# Patient Record
Sex: Male | Born: 1945
Health system: Southern US, Community
[De-identification: ages and names within clinical notes are randomized; demographics above are authoritative.]

## PROBLEM LIST (undated history)

## (undated) ENCOUNTER — Emergency Department (HOSPITAL_COMMUNITY): Discharge status: Absent without leave | Payer: PRIVATE HEALTH INSURANCE

## (undated) DIAGNOSIS — E785 Hyperlipidemia, unspecified: Secondary | ICD-10-CM

## (undated) DIAGNOSIS — I219 Acute myocardial infarction, unspecified: Secondary | ICD-10-CM

## (undated) DIAGNOSIS — I10 Essential (primary) hypertension: Secondary | ICD-10-CM

## (undated) DIAGNOSIS — Z8669 Personal history of other diseases of the nervous system and sense organs: Secondary | ICD-10-CM

## (undated) DIAGNOSIS — N200 Calculus of kidney: Secondary | ICD-10-CM

## (undated) DIAGNOSIS — C4490 Unspecified malignant neoplasm of skin, unspecified: Secondary | ICD-10-CM

## (undated) DIAGNOSIS — C801 Malignant (primary) neoplasm, unspecified: Secondary | ICD-10-CM

## (undated) DIAGNOSIS — K649 Unspecified hemorrhoids: Secondary | ICD-10-CM

## (undated) DIAGNOSIS — I2510 Atherosclerotic heart disease of native coronary artery without angina pectoris: Secondary | ICD-10-CM

## (undated) DIAGNOSIS — R0602 Shortness of breath: Secondary | ICD-10-CM

## (undated) DIAGNOSIS — Z87442 Personal history of urinary calculi: Secondary | ICD-10-CM

## (undated) DIAGNOSIS — D649 Anemia, unspecified: Secondary | ICD-10-CM

## (undated) DIAGNOSIS — M1990 Unspecified osteoarthritis, unspecified site: Secondary | ICD-10-CM

## (undated) DIAGNOSIS — M069 Rheumatoid arthritis, unspecified: Secondary | ICD-10-CM

## (undated) DIAGNOSIS — I639 Cerebral infarction, unspecified: Secondary | ICD-10-CM

## (undated) HISTORY — DX: Rheumatoid arthritis, unspecified (CMS HCC): M06.9

## (undated) HISTORY — PX: HX CORONARY ARTERY BYPASS GRAFT: SHX141

## (undated) HISTORY — DX: Cerebral infarction, unspecified (CMS HCC): I63.9

## (undated) HISTORY — PX: LUNG BIOPSY: SHX232

## (undated) HISTORY — DX: Personal history of other diseases of the nervous system and sense organs: Z86.69

## (undated) HISTORY — DX: Personal history of urinary calculi: Z87.442

## (undated) HISTORY — DX: Anemia, unspecified: D64.9

## (undated) HISTORY — PX: BACK SURGERY: SHX140

## (undated) HISTORY — DX: Essential (primary) hypertension: I10

## (undated) HISTORY — DX: Atherosclerotic heart disease of native coronary artery without angina pectoris: I25.10

---

## 1999-01-15 ENCOUNTER — Emergency Department (HOSPITAL_COMMUNITY): Admission: EM | Admit: 1999-01-15 | Discharge: 1999-01-16 | Payer: Self-pay | Admitting: *Deleted

## 1999-02-23 ENCOUNTER — Ambulatory Visit (HOSPITAL_COMMUNITY): Admission: RE | Admit: 1999-02-23 | Discharge: 1999-02-23 | Payer: Self-pay | Admitting: Urology

## 1999-02-23 ENCOUNTER — Encounter: Payer: Self-pay | Admitting: Urology

## 1999-05-12 ENCOUNTER — Encounter: Payer: Self-pay | Admitting: Urology

## 1999-05-12 ENCOUNTER — Ambulatory Visit (HOSPITAL_COMMUNITY): Admission: RE | Admit: 1999-05-12 | Discharge: 1999-05-12 | Payer: Self-pay | Admitting: Urology

## 1999-05-28 ENCOUNTER — Encounter: Payer: Self-pay | Admitting: Urology

## 1999-05-28 ENCOUNTER — Encounter: Admission: RE | Admit: 1999-05-28 | Discharge: 1999-05-28 | Payer: Self-pay | Admitting: Urology

## 1999-05-29 ENCOUNTER — Ambulatory Visit (HOSPITAL_BASED_OUTPATIENT_CLINIC_OR_DEPARTMENT_OTHER): Admission: RE | Admit: 1999-05-29 | Discharge: 1999-05-29 | Payer: Self-pay | Admitting: Urology

## 2002-05-30 ENCOUNTER — Encounter: Payer: Self-pay | Admitting: Family Medicine

## 2002-05-30 ENCOUNTER — Ambulatory Visit (HOSPITAL_COMMUNITY): Admission: RE | Admit: 2002-05-30 | Discharge: 2002-05-30 | Payer: Self-pay | Admitting: Family Medicine

## 2002-07-16 ENCOUNTER — Encounter: Payer: Self-pay | Admitting: Neurosurgery

## 2002-07-17 ENCOUNTER — Ambulatory Visit (HOSPITAL_COMMUNITY): Admission: RE | Admit: 2002-07-17 | Discharge: 2002-07-18 | Payer: Self-pay | Admitting: Neurosurgery

## 2002-07-17 ENCOUNTER — Encounter: Payer: Self-pay | Admitting: Neurosurgery

## 2002-08-17 ENCOUNTER — Emergency Department (HOSPITAL_COMMUNITY): Admission: EM | Admit: 2002-08-17 | Discharge: 2002-08-17 | Payer: Self-pay | Admitting: Emergency Medicine

## 2002-08-17 ENCOUNTER — Encounter: Payer: Self-pay | Admitting: Emergency Medicine

## 2007-01-09 ENCOUNTER — Ambulatory Visit (HOSPITAL_COMMUNITY): Payer: Self-pay | Admitting: EXTERNAL

## 2010-10-14 ENCOUNTER — Inpatient Hospital Stay (HOSPITAL_COMMUNITY)
Admission: EM | Admit: 2010-10-14 | Discharge: 2010-10-25 | DRG: 107 | Disposition: A | Discharge status: Home / Self Care | Payer: BC Managed Care – PPO | Source: Ambulatory Visit | Attending: Cardiothoracic Surgery | Admitting: Cardiothoracic Surgery

## 2010-10-14 DIAGNOSIS — Z87442 Personal history of urinary calculi: Secondary | ICD-10-CM

## 2010-10-14 DIAGNOSIS — I214 Non-ST elevation (NSTEMI) myocardial infarction: Principal | ICD-10-CM | POA: Diagnosis present

## 2010-10-14 DIAGNOSIS — I10 Essential (primary) hypertension: Secondary | ICD-10-CM | POA: Diagnosis present

## 2010-10-14 DIAGNOSIS — D62 Acute posthemorrhagic anemia: Secondary | ICD-10-CM | POA: Diagnosis not present

## 2010-10-14 DIAGNOSIS — E119 Type 2 diabetes mellitus without complications: Secondary | ICD-10-CM | POA: Diagnosis present

## 2010-10-14 DIAGNOSIS — Z7982 Long term (current) use of aspirin: Secondary | ICD-10-CM

## 2010-10-14 DIAGNOSIS — I498 Other specified cardiac arrhythmias: Secondary | ICD-10-CM | POA: Diagnosis present

## 2010-10-14 DIAGNOSIS — Z8249 Family history of ischemic heart disease and other diseases of the circulatory system: Secondary | ICD-10-CM

## 2010-10-14 DIAGNOSIS — R079 Chest pain, unspecified: Secondary | ICD-10-CM

## 2010-10-14 DIAGNOSIS — I2510 Atherosclerotic heart disease of native coronary artery without angina pectoris: Secondary | ICD-10-CM | POA: Diagnosis present

## 2010-10-14 DIAGNOSIS — R51 Headache: Secondary | ICD-10-CM | POA: Diagnosis present

## 2010-10-15 ENCOUNTER — Emergency Department (HOSPITAL_COMMUNITY): Payer: BC Managed Care – PPO

## 2010-10-15 DIAGNOSIS — I214 Non-ST elevation (NSTEMI) myocardial infarction: Secondary | ICD-10-CM

## 2010-10-15 DIAGNOSIS — I2510 Atherosclerotic heart disease of native coronary artery without angina pectoris: Secondary | ICD-10-CM

## 2010-10-15 HISTORY — PX: CARDIAC CATHETERIZATION: SHX172

## 2010-10-15 LAB — COMPREHENSIVE METABOLIC PANEL
ALT: 22 U/L (ref 0–53)
AST: 18 U/L (ref 0–37)
Albumin: 3.7 g/dL (ref 3.5–5.2)
Alkaline Phosphatase: 107 U/L (ref 39–117)
Potassium: 3.5 mEq/L (ref 3.5–5.1)
Sodium: 135 mEq/L (ref 135–145)
Total Protein: 7 g/dL (ref 6.0–8.3)

## 2010-10-15 LAB — DIFFERENTIAL
Basophils Absolute: 0.1 10*3/uL (ref 0.0–0.1)
Basophils Relative: 1 % (ref 0–1)
Eosinophils Absolute: 0.2 10*3/uL (ref 0.0–0.7)
Eosinophils Relative: 2 % (ref 0–5)
Lymphocytes Relative: 28 % (ref 12–46)
Monocytes Absolute: 1 10*3/uL (ref 0.1–1.0)

## 2010-10-15 LAB — MRSA PCR SCREENING: MRSA by PCR: NEGATIVE

## 2010-10-15 LAB — CBC
HCT: 42 % (ref 39.0–52.0)
MCHC: 36 g/dL (ref 30.0–36.0)
Platelets: 207 10*3/uL (ref 150–400)
RDW: 12.4 % (ref 11.5–15.5)

## 2010-10-15 LAB — CK TOTAL AND CKMB (NOT AT ARMC): Relative Index: INVALID (ref 0.0–2.5)

## 2010-10-15 LAB — PROTIME-INR: INR: 0.98 (ref 0.00–1.49)

## 2010-10-15 LAB — LIPID PANEL
LDL Cholesterol: 86 mg/dL (ref 0–99)
Triglycerides: 174 mg/dL — ABNORMAL HIGH (ref <150)
VLDL: 35 mg/dL (ref 0–40)

## 2010-10-15 LAB — CARDIAC PANEL(CRET KIN+CKTOT+MB+TROPI)
CK, MB: 10.4 ng/mL (ref 0.3–4.0)
Total CK: 86 U/L (ref 7–232)

## 2010-10-15 LAB — TROPONIN I
Troponin I: 0.78 ng/mL (ref <0.30)
Troponin I: <0.3 ng/mL (ref <0.30)

## 2010-10-15 LAB — GLUCOSE, CAPILLARY: Glucose-Capillary: 216 mg/dL — ABNORMAL HIGH (ref 70–99)

## 2010-10-16 ENCOUNTER — Inpatient Hospital Stay (HOSPITAL_COMMUNITY): Payer: BC Managed Care – PPO

## 2010-10-16 DIAGNOSIS — Z01810 Encounter for preprocedural cardiovascular examination: Secondary | ICD-10-CM

## 2010-10-16 DIAGNOSIS — I2510 Atherosclerotic heart disease of native coronary artery without angina pectoris: Secondary | ICD-10-CM

## 2010-10-16 LAB — CBC
MCV: 85.5 fL (ref 78.0–100.0)
Platelets: 221 10*3/uL (ref 150–400)
RBC: 4.61 MIL/uL (ref 4.22–5.81)
WBC: 13 10*3/uL — ABNORMAL HIGH (ref 4.0–10.5)

## 2010-10-16 LAB — BASIC METABOLIC PANEL
CO2: 26 mEq/L (ref 19–32)
Chloride: 101 mEq/L (ref 96–112)
Creatinine, Ser: 0.65 mg/dL (ref 0.50–1.35)
Potassium: 4.1 mEq/L (ref 3.5–5.1)
Sodium: 137 mEq/L (ref 135–145)

## 2010-10-16 LAB — GLUCOSE, CAPILLARY
Glucose-Capillary: 163 mg/dL — ABNORMAL HIGH (ref 70–99)
Glucose-Capillary: 175 mg/dL — ABNORMAL HIGH (ref 70–99)
Glucose-Capillary: 184 mg/dL — ABNORMAL HIGH (ref 70–99)

## 2010-10-16 LAB — HEPARIN LEVEL (UNFRACTIONATED): Heparin Unfractionated: 0.17 IU/mL — ABNORMAL LOW (ref 0.30–0.70)

## 2010-10-16 NOTE — Cardiovascular Report (Signed)
  NAME:  Mathew Clements, Mathew Clements NO.:  [id number]  MEDICAL RECORD NO.:  [id number]  LOCATION:                                 FACILITY:  PHYSICIAN:  Rollene Rotunda, MD, FACCDATE OF BIRTH:  [birth year]  DATE OF PROCEDURE:  10/15/2010 DATE OF DISCHARGE:                           CARDIAC CATHETERIZATION   PROCEDURE:  Left heart catheterization/coronary arteriography.  INDICATIONS:  The patient with non-Q-wave myocardial infarction.  PROCEDURE NOTE:  Left heart catheterization was performed via right femoral artery.  The artery was cannulated using anterior wall puncture. A #5 French arterial sheath was inserted via the modified Seldinger technique.  Preformed Judkins and a pigtail catheter were utilized.  The patient tolerated the procedure well and left the lab in stable condition.  RESULTS:  Hemodynamics:  LV 132/85, AO 120/12.  Coronaries:  Left main was normal.  The LAD had long proximal 80% stenosis bridging a large proximal diagonal.  There was diffuse distal disease with long distal 50% followed by distal to apical long 80% stenosis.  First diagonal was large branching vessel with ostial 50% stenosis.  A superior branch had proximal 75% stenosis.  The circumflex was a very large vessel.  In the AV groove, there was ostial 25% stenosis.  An obtuse marginal 1 was small with ostial 99% stenosis and obtuse marginal 2 was moderate-sized with ostial 95% stenosis and obtuse marginal 3 was large and normal. The right coronary artery was dominant.  There was diffuse mid 30-40% lesions.  PDA was moderate-sized with ostial 60% stenosis followed by mid 75% stenosis but the remainder of the vessel was small. Posterolateral was large with ostial 30% stenosis.  Left ventriculogram: The left ventriculogram was obtained in the RAO projection.  The EF was 65% with normal wall motion.  CONCLUSION:  Severe LAD disease and branch vessel circumflex disease.  PLAN:  I have reviewed  the films with Dr. Riley Kill.  Percutaneous revascularization of the LAD would be somewhat complex with involvement of the diagonal.  We will review the films further with other interventionalist and consider PCI versus CABG.     Rollene Rotunda, MD, [facility]     JH/MEDQ  D:  10/15/2010  T:  10/16/2010  Job:  [id number]  Electronically Signed by Rollene Rotunda MD [facility] on 10/16/2010 07:54:48 PM

## 2010-10-17 LAB — CBC
HCT: 37.5 % — ABNORMAL LOW (ref 39.0–52.0)
MCH: 30.5 pg (ref 26.0–34.0)
MCHC: 35.7 g/dL (ref 30.0–36.0)
MCV: 85.2 fL (ref 78.0–100.0)
Platelets: 209 10*3/uL (ref 150–400)
RDW: 12.4 % (ref 11.5–15.5)
WBC: 12 10*3/uL — ABNORMAL HIGH (ref 4.0–10.5)

## 2010-10-17 LAB — GLUCOSE, CAPILLARY
Glucose-Capillary: 144 mg/dL — ABNORMAL HIGH (ref 70–99)
Glucose-Capillary: 134 mg/dL — ABNORMAL HIGH (ref 70–99)

## 2010-10-18 LAB — BASIC METABOLIC PANEL WITH GFR
BUN: 16 mg/dL (ref 6–23)
CO2: 28 meq/L (ref 19–32)
Calcium: 9.7 mg/dL (ref 8.4–10.5)
Chloride: 103 meq/L (ref 96–112)
Creatinine, Ser: 0.84 mg/dL (ref 0.50–1.35)
GFR calc Af Amer: >60 mL/min
GFR calc non Af Amer: >60 mL/min
Glucose, Bld: 131 mg/dL — ABNORMAL HIGH (ref 70–99)
Potassium: 3.9 meq/L (ref 3.5–5.1)
Sodium: 140 meq/L (ref 135–145)

## 2010-10-18 LAB — GLUCOSE, CAPILLARY
Glucose-Capillary: 138 mg/dL — ABNORMAL HIGH (ref 70–99)
Glucose-Capillary: 136 mg/dL — ABNORMAL HIGH (ref 70–99)
Glucose-Capillary: 178 mg/dL — ABNORMAL HIGH (ref 70–99)
Glucose-Capillary: 124 mg/dL — ABNORMAL HIGH (ref 70–99)

## 2010-10-18 LAB — CBC
Hemoglobin: 13.4 g/dL (ref 13.0–17.0)
MCH: 30.2 pg (ref 26.0–34.0)
MCHC: 35.2 g/dL (ref 30.0–36.0)
Platelets: 223 10*3/uL (ref 150–400)
RDW: 12.6 % (ref 11.5–15.5)

## 2010-10-18 LAB — HEPARIN LEVEL (UNFRACTIONATED): Heparin Unfractionated: 0.49 [IU]/mL (ref 0.30–0.70)

## 2010-10-19 ENCOUNTER — Inpatient Hospital Stay (HOSPITAL_COMMUNITY): Payer: BC Managed Care – PPO

## 2010-10-19 DIAGNOSIS — I2510 Atherosclerotic heart disease of native coronary artery without angina pectoris: Secondary | ICD-10-CM

## 2010-10-19 DIAGNOSIS — Z01810 Encounter for preprocedural cardiovascular examination: Secondary | ICD-10-CM

## 2010-10-19 LAB — BASIC METABOLIC PANEL
BUN: 17 mg/dL (ref 6–23)
CO2: 27 mEq/L (ref 19–32)
Calcium: 9.9 mg/dL (ref 8.4–10.5)
Chloride: 102 mEq/L (ref 96–112)
Creatinine, Ser: 0.81 mg/dL (ref 0.50–1.35)
GFR calc Af Amer: >60 mL/min (ref >60)
GFR calc non Af Amer: >60 mL/min (ref >60)
Glucose, Bld: 189 mg/dL — ABNORMAL HIGH (ref 70–99)
Potassium: 4 mEq/L (ref 3.5–5.1)
Sodium: 139 mEq/L (ref 135–145)

## 2010-10-19 LAB — TYPE AND SCREEN
ABO/RH(D): O POS
Antibody Screen: NEGATIVE

## 2010-10-19 LAB — BLOOD GAS, ARTERIAL
Drawn by: 229971
TCO2: 27 mmol/L (ref 0–100)
pCO2 arterial: 41 mmHg (ref 35.0–45.0)
pH, Arterial: 7.414 (ref 7.350–7.450)
pO2, Arterial: 69.1 mmHg — ABNORMAL LOW (ref 80.0–100.0)

## 2010-10-19 LAB — CBC
HCT: 41.6 % (ref 39.0–52.0)
Hemoglobin: 15 g/dL (ref 13.0–17.0)
MCH: 30.9 pg (ref 26.0–34.0)
MCHC: 36.1 g/dL — ABNORMAL HIGH (ref 30.0–36.0)
MCV: 85.8 fL (ref 78.0–100.0)
Platelets: 250 10*3/uL (ref 150–400)
RBC: 4.85 MIL/uL (ref 4.22–5.81)
RDW: 12.6 % (ref 11.5–15.5)
WBC: 11.4 10*3/uL — ABNORMAL HIGH (ref 4.0–10.5)

## 2010-10-19 LAB — HEMOGLOBIN A1C
Hgb A1c MFr Bld: 7.4 % — ABNORMAL HIGH (ref <5.7)
Mean Plasma Glucose: 166 mg/dL — ABNORMAL HIGH (ref <117)

## 2010-10-19 LAB — PLATELET INHIBITION P2Y12
P2Y12 % Inhibition: 0 %
Platelet Function  P2Y12: 257 [PRU] (ref 194–418)
Platelet Function Baseline: 233 [PRU] (ref 194–418)

## 2010-10-19 LAB — APTT: aPTT: 69 seconds — ABNORMAL HIGH (ref 24–37)

## 2010-10-19 LAB — PROTIME-INR
INR: 1.01 (ref 0.00–1.49)
Prothrombin Time: 13.5 seconds (ref 11.6–15.2)

## 2010-10-19 LAB — GLUCOSE, CAPILLARY: Glucose-Capillary: 217 mg/dL — ABNORMAL HIGH (ref 70–99)

## 2010-10-19 LAB — HEPARIN LEVEL (UNFRACTIONATED): Heparin Unfractionated: 0.5 IU/mL (ref 0.30–0.70)

## 2010-10-20 ENCOUNTER — Inpatient Hospital Stay (HOSPITAL_COMMUNITY): Payer: BC Managed Care – PPO

## 2010-10-20 DIAGNOSIS — I2510 Atherosclerotic heart disease of native coronary artery without angina pectoris: Secondary | ICD-10-CM

## 2010-10-20 HISTORY — PX: CORONARY ARTERY BYPASS GRAFT: SHX141

## 2010-10-20 LAB — CBC
HCT: 31.3 % — ABNORMAL LOW (ref 39.0–52.0)
HCT: 32.5 % — ABNORMAL LOW (ref 39.0–52.0)
Hemoglobin: 11.3 g/dL — ABNORMAL LOW (ref 13.0–17.0)
Hemoglobin: 11.6 g/dL — ABNORMAL LOW (ref 13.0–17.0)
MCH: 30.1 pg (ref 26.0–34.0)
MCH: 30.5 pg (ref 26.0–34.0)
MCH: 30.4 pg (ref 26.0–34.0)
MCHC: 36.1 g/dL — ABNORMAL HIGH (ref 30.0–36.0)
MCHC: 35.7 g/dL (ref 30.0–36.0)
MCV: 84.4 fL (ref 78.0–100.0)
MCV: 84.4 fL (ref 78.0–100.0)
MCV: 85.1 fL (ref 78.0–100.0)
Platelets: 252 10*3/uL (ref 150–400)
Platelets: 150 10*3/uL (ref 150–400)
Platelets: 160 10*3/uL (ref 150–400)
RBC: 3.71 MIL/uL — ABNORMAL LOW (ref 4.22–5.81)
RBC: 3.82 MIL/uL — ABNORMAL LOW (ref 4.22–5.81)
RDW: 12.5 % (ref 11.5–15.5)
RDW: 12.5 % (ref 11.5–15.5)
RDW: 12.7 % (ref 11.5–15.5)
WBC: 13.7 10*3/uL — ABNORMAL HIGH (ref 4.0–10.5)
WBC: 15.6 10*3/uL — ABNORMAL HIGH (ref 4.0–10.5)

## 2010-10-20 LAB — BASIC METABOLIC PANEL
BUN: 13 mg/dL (ref 6–23)
Calcium: 9.9 mg/dL (ref 8.4–10.5)
Creatinine, Ser: 0.63 mg/dL (ref 0.50–1.35)
GFR calc Af Amer: >60 mL/min (ref >60)
GFR calc non Af Amer: >60 mL/min (ref >60)

## 2010-10-20 LAB — POCT I-STAT 4, (NA,K, GLUC, HGB,HCT)
Glucose, Bld: 140 mg/dL — ABNORMAL HIGH (ref 70–99)
HCT: 38 % — ABNORMAL LOW (ref 39.0–52.0)
HCT: 28 % — ABNORMAL LOW (ref 39.0–52.0)
HCT: 36 % — ABNORMAL LOW (ref 39.0–52.0)
HCT: 28 % — ABNORMAL LOW (ref 39.0–52.0)
Hemoglobin: 12.9 g/dL — ABNORMAL LOW (ref 13.0–17.0)
Hemoglobin: 12.2 g/dL — ABNORMAL LOW (ref 13.0–17.0)
Hemoglobin: 9.9 g/dL — ABNORMAL LOW (ref 13.0–17.0)
Potassium: 4.1 mEq/L (ref 3.5–5.1)
Potassium: 4.3 mEq/L (ref 3.5–5.1)
Sodium: 138 mEq/L (ref 135–145)
Sodium: 137 mEq/L (ref 135–145)

## 2010-10-20 LAB — POCT I-STAT 3, ART BLOOD GAS (G3+)
Acid-Base Excess: 1 mmol/L (ref 0.0–2.0)
Acid-base deficit: 3 mmol/L — ABNORMAL HIGH (ref 0.0–2.0)
Bicarbonate: 24.4 mEq/L — ABNORMAL HIGH (ref 20.0–24.0)
Bicarbonate: 23.7 mEq/L (ref 20.0–24.0)
Bicarbonate: 22.9 mEq/L (ref 20.0–24.0)
O2 Saturation: 100 %
O2 Saturation: 100 %
O2 Saturation: 98 %
Patient temperature: 36.4
TCO2: 25 mmol/L (ref 0–100)
TCO2: 24 mmol/L (ref 0–100)
pCO2 arterial: 43.5 mmHg (ref 35.0–45.0)
pCO2 arterial: 35.8 mmHg (ref 35.0–45.0)
pCO2 arterial: 36.8 mmHg (ref 35.0–45.0)
pH, Arterial: 7.372 (ref 7.350–7.450)
pH, Arterial: 7.41 (ref 7.350–7.450)
pO2, Arterial: 387 mmHg — ABNORMAL HIGH (ref 80.0–100.0)
pO2, Arterial: 384 mmHg — ABNORMAL HIGH (ref 80.0–100.0)
pO2, Arterial: 276 mmHg — ABNORMAL HIGH (ref 80.0–100.0)
pO2, Arterial: 73 mmHg — ABNORMAL LOW (ref 80.0–100.0)

## 2010-10-20 LAB — PROTIME-INR
INR: 1.44 (ref 0.00–1.49)
Prothrombin Time: 17.8 seconds — ABNORMAL HIGH (ref 11.6–15.2)

## 2010-10-20 LAB — CREATININE, SERUM
Creatinine, Ser: 0.65 mg/dL (ref 0.50–1.35)
GFR calc Af Amer: >60 mL/min (ref >60)
GFR calc non Af Amer: >60 mL/min (ref >60)

## 2010-10-20 LAB — HEPARIN LEVEL (UNFRACTIONATED): Heparin Unfractionated: 0.35 IU/mL (ref 0.30–0.70)

## 2010-10-20 LAB — POCT I-STAT, CHEM 8
BUN: 12 mg/dL (ref 6–23)
Calcium, Ion: 1.17 mmol/L (ref 1.12–1.32)
Creatinine, Ser: 0.8 mg/dL (ref 0.50–1.35)
Glucose, Bld: 121 mg/dL — ABNORMAL HIGH (ref 70–99)
TCO2: 21 mmol/L (ref 0–100)

## 2010-10-20 LAB — MAGNESIUM: Magnesium: 2.3 mg/dL (ref 1.5–2.5)

## 2010-10-20 LAB — APTT: aPTT: 34 seconds (ref 24–37)

## 2010-10-21 ENCOUNTER — Inpatient Hospital Stay (HOSPITAL_COMMUNITY): Payer: BC Managed Care – PPO

## 2010-10-21 LAB — GLUCOSE, CAPILLARY
Glucose-Capillary: 118 mg/dL — ABNORMAL HIGH (ref 70–99)
Glucose-Capillary: 123 mg/dL — ABNORMAL HIGH (ref 70–99)
Glucose-Capillary: 128 mg/dL — ABNORMAL HIGH (ref 70–99)
Glucose-Capillary: 129 mg/dL — ABNORMAL HIGH (ref 70–99)
Glucose-Capillary: 129 mg/dL — ABNORMAL HIGH (ref 70–99)
Glucose-Capillary: 137 mg/dL — ABNORMAL HIGH (ref 70–99)
Glucose-Capillary: 138 mg/dL — ABNORMAL HIGH (ref 70–99)
Glucose-Capillary: 143 mg/dL — ABNORMAL HIGH (ref 70–99)
Glucose-Capillary: 121 mg/dL — ABNORMAL HIGH (ref 70–99)
Glucose-Capillary: 134 mg/dL — ABNORMAL HIGH (ref 70–99)
Glucose-Capillary: 130 mg/dL — ABNORMAL HIGH (ref 70–99)
Glucose-Capillary: 180 mg/dL — ABNORMAL HIGH (ref 70–99)
Glucose-Capillary: 231 mg/dL — ABNORMAL HIGH (ref 70–99)

## 2010-10-21 LAB — BASIC METABOLIC PANEL
BUN: 13 mg/dL (ref 6–23)
CO2: 24 mEq/L (ref 19–32)
Calcium: 8.4 mg/dL (ref 8.4–10.5)
Chloride: 106 mEq/L (ref 96–112)
Creatinine, Ser: 0.69 mg/dL (ref 0.50–1.35)
GFR calc Af Amer: >60 mL/min (ref >60)
GFR calc non Af Amer: >60 mL/min (ref >60)
Glucose, Bld: 124 mg/dL — ABNORMAL HIGH (ref 70–99)
Potassium: 4.7 mEq/L (ref 3.5–5.1)
Sodium: 137 mEq/L (ref 135–145)

## 2010-10-21 LAB — CBC
HCT: 34.1 % — ABNORMAL LOW (ref 39.0–52.0)
HCT: 34.4 % — ABNORMAL LOW (ref 39.0–52.0)
Hemoglobin: 12.3 g/dL — ABNORMAL LOW (ref 13.0–17.0)
Hemoglobin: 12 g/dL — ABNORMAL LOW (ref 13.0–17.0)
MCH: 31.1 pg (ref 26.0–34.0)
MCH: 30.4 pg (ref 26.0–34.0)
MCHC: 36.1 g/dL — ABNORMAL HIGH (ref 30.0–36.0)
MCHC: 34.9 g/dL (ref 30.0–36.0)
MCV: 86.1 fL (ref 78.0–100.0)
MCV: 87.1 fL (ref 78.0–100.0)
Platelets: 173 10*3/uL (ref 150–400)
Platelets: 179 10*3/uL (ref 150–400)
RBC: 3.96 MIL/uL — ABNORMAL LOW (ref 4.22–5.81)
RBC: 3.95 MIL/uL — ABNORMAL LOW (ref 4.22–5.81)
RDW: 12.8 % (ref 11.5–15.5)
RDW: 13.2 % (ref 11.5–15.5)
WBC: 18.4 10*3/uL — ABNORMAL HIGH (ref 4.0–10.5)
WBC: 18 10*3/uL — ABNORMAL HIGH (ref 4.0–10.5)

## 2010-10-21 LAB — POCT I-STAT, CHEM 8
Calcium, Ion: 1.25 mmol/L (ref 1.12–1.32)
Chloride: 101 mEq/L (ref 96–112)
Glucose, Bld: 177 mg/dL — ABNORMAL HIGH (ref 70–99)
HCT: 35 % — ABNORMAL LOW (ref 39.0–52.0)
TCO2: 25 mmol/L (ref 0–100)

## 2010-10-21 LAB — MAGNESIUM
Magnesium: 2.2 mg/dL (ref 1.5–2.5)
Magnesium: 2.3 mg/dL (ref 1.5–2.5)

## 2010-10-21 LAB — CREATININE, SERUM
Creatinine, Ser: 0.78 mg/dL (ref 0.50–1.35)
GFR calc Af Amer: >60 mL/min (ref >60)
GFR calc non Af Amer: >60 mL/min (ref >60)

## 2010-10-22 ENCOUNTER — Inpatient Hospital Stay (HOSPITAL_COMMUNITY): Payer: BC Managed Care – PPO

## 2010-10-22 DIAGNOSIS — E119 Type 2 diabetes mellitus without complications: Secondary | ICD-10-CM

## 2010-10-22 LAB — BASIC METABOLIC PANEL
BUN: 17 mg/dL (ref 6–23)
CO2: 27 mEq/L (ref 19–32)
Calcium: 8.8 mg/dL (ref 8.4–10.5)
Chloride: 102 mEq/L (ref 96–112)
Creatinine, Ser: 0.75 mg/dL (ref 0.50–1.35)
GFR calc Af Amer: >60 mL/min (ref >60)
GFR calc non Af Amer: >60 mL/min (ref >60)
Glucose, Bld: 152 mg/dL — ABNORMAL HIGH (ref 70–99)
Potassium: 4.5 mEq/L (ref 3.5–5.1)
Sodium: 135 mEq/L (ref 135–145)

## 2010-10-22 LAB — CBC
HCT: 32.9 % — ABNORMAL LOW (ref 39.0–52.0)
Hemoglobin: 11.6 g/dL — ABNORMAL LOW (ref 13.0–17.0)
MCH: 30.8 pg (ref 26.0–34.0)
MCHC: 35.3 g/dL (ref 30.0–36.0)
MCV: 87.3 fL (ref 78.0–100.0)
Platelets: 171 10*3/uL (ref 150–400)
RBC: 3.77 MIL/uL — ABNORMAL LOW (ref 4.22–5.81)
RDW: 13.2 % (ref 11.5–15.5)
WBC: 18.1 10*3/uL — ABNORMAL HIGH (ref 4.0–10.5)

## 2010-10-22 LAB — GLUCOSE, CAPILLARY
Glucose-Capillary: 140 mg/dL — ABNORMAL HIGH (ref 70–99)
Glucose-Capillary: 150 mg/dL — ABNORMAL HIGH (ref 70–99)
Glucose-Capillary: 192 mg/dL — ABNORMAL HIGH (ref 70–99)
Glucose-Capillary: 168 mg/dL — ABNORMAL HIGH (ref 70–99)

## 2010-10-23 ENCOUNTER — Inpatient Hospital Stay (HOSPITAL_COMMUNITY): Payer: BC Managed Care – PPO

## 2010-10-23 LAB — GLUCOSE, CAPILLARY
Glucose-Capillary: 190 mg/dL — ABNORMAL HIGH (ref 70–99)
Glucose-Capillary: 148 mg/dL — ABNORMAL HIGH (ref 70–99)

## 2010-10-23 LAB — CBC
HCT: 31.5 % — ABNORMAL LOW (ref 39.0–52.0)
Hemoglobin: 10.9 g/dL — ABNORMAL LOW (ref 13.0–17.0)
MCH: 30.2 pg (ref 26.0–34.0)
MCHC: 34.6 g/dL (ref 30.0–36.0)
MCV: 87.3 fL (ref 78.0–100.0)
Platelets: 199 10*3/uL (ref 150–400)
RBC: 3.61 MIL/uL — ABNORMAL LOW (ref 4.22–5.81)
RDW: 13 % (ref 11.5–15.5)
WBC: 15.4 10*3/uL — ABNORMAL HIGH (ref 4.0–10.5)

## 2010-10-23 LAB — BASIC METABOLIC PANEL
BUN: 17 mg/dL (ref 6–23)
CO2: 28 mEq/L (ref 19–32)
Calcium: 8.8 mg/dL (ref 8.4–10.5)
Chloride: 99 mEq/L (ref 96–112)
Creatinine, Ser: 0.74 mg/dL (ref 0.50–1.35)
GFR calc Af Amer: >60 mL/min (ref >60)
GFR calc non Af Amer: >60 mL/min (ref >60)
Glucose, Bld: 146 mg/dL — ABNORMAL HIGH (ref 70–99)
Potassium: 3.9 mEq/L (ref 3.5–5.1)
Sodium: 134 mEq/L — ABNORMAL LOW (ref 135–145)

## 2010-10-25 LAB — GLUCOSE, CAPILLARY: Glucose-Capillary: 146 mg/dL — ABNORMAL HIGH (ref 70–99)
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
## 2010-11-02 NOTE — Op Note (Signed)
NAME:  Mathew Clements, TAY NO.:  [id number]  MEDICAL RECORD NO.:  [id number]  LOCATION:  2316                         FACILITY:  [facility]  PHYSICIAN:  Sheliah Plane, MD    DATE OF BIRTH:  1945-11-01  DATE OF PROCEDURE:  10/20/2010 DATE OF DISCHARGE:                              OPERATIVE REPORT   PREOPERATIVE DIAGNOSES:  Coronary occlusive disease with non-STEMI myocardial infarction.  POSTOPERATIVE DIAGNOSES:  Coronary occlusive disease with non-STEMI myocardial infarction.  SURGICAL PROCEDURES:  Coronary artery bypass grafting x3 with the left internal mammary to the left anterior descending coronary artery, reverse saphenous vein graft to the first diagonal coronary artery, and reverse saphenous vein graft to the posterior descending coronary artery, with left thigh endovein harvesting.  SURGEON:  Sheliah Plane, MD.  FIRST ASSISTANT:  Zadie Rhine, PA.  BRIEF HISTORY:  The patient is a 65 year old male with no previous history of coronary artery disease, who presents with prolonged episode of chest pain, shortness of breath with elevation of his cardiac enzymes.  He was stabilized medically, loaded with Plavix, and underwent cardiac catheterization.  At the time of catheterization, he had a complex proximal LAD lesion of 80-90%.  Two small obtuse marginal vessels were diseased and an 80% ostial posterior descending lesion. Coronary artery bypass grafting was recommended.  Several days of Plavix washout were allowed and the patient then agreed.  After discussing the risks and options, signed informed consent for coronary artery bypass grafting.  This had known varicosities.  Venous mapping demonstrated the left leg with superior vein.  DESCRIPTION OF PROCEDURE:  With Swan-Ganz and arterial line monitors in place, the patient underwent general endotracheal anesthesia without incidence.  Skin of the chest and legs was prepped with Betadine and draped  in the usual sterile manner.  Using the Guidant endovein harvesting system, vein was harvested from the left to just below the knee.  Median sternotomy was performed.  Left internal mammary artery was dissected down as pedicle graft.  Distal artery was divided.  Had good free flow.  Pericardium was opened.  Overall ventricular function appeared preserved.  The patient was systemically heparinized.  The ascending aorta was cannulated.  The right atrium was cannulated.  The aortic root vent cardioplegia needle was introduced into the ascending aorta.  The patient was placed on cardiopulmonary bypass 2.4 liters/minute/meter squared.  Sites of anastomosis were selected and dissected out of the epicardium.  The obtuse marginal vessels and lateral wall were extremely small and not large enough for bypass.  The first diagonal was at least a 1.5 mm vessel.  The posterior descending was had some diffuse disease proximally, but was suitable vessel for bypass in the proximal third.  The lesion was at the ostium at the takeoff of the body of the right coronary artery and the posterior lateral branches of the right were free of disease.  The patient's body temperature was cooled to 32 degrees.  Aortic crossclamp was applied.  A 500 mL of cold blood potassium cardioplegia was administered with diastolic arrest of heart.  Myocardial septal temperatures were monitored throughout the crossclamp.  Attention was turned first to the posterior descending coronary  artery, which was opened and admitted 1.5 mm probe.  Using a running 7-0 Prolene, distal anastomosis was performed.  The heart was then elevated and the first diagonal coronary artery was opened and admitted 1.5-mm probe.  Using a second reverse saphenous vein graft, a distal anastomosis was performed.  Attention was then turned to the left anterior descending coronary artery and the vessel was diffusely diseased between the mid and distal third.  The  LAD was opened and admitted 1.5-mm probe distally and proximally.  Using a running 8-0 Prolene, the left internal mammary artery was anastomosed to the anterior descending coronary artery.  With release of the bulldog on the mammary artery, there was appropriate rise in myocardial septal temperature.  The bulldog was placed back on the mammary artery with crossclamp still in place, two punch aortotomies were performed, and each of the two vein grafts were anastomosed to the ascending aorta. Air was evacuated from the ascending aorta and grafts.  Crossclamp was removed.  Total crossclamp time 56 minutes.  Atrial and ventricular pacing wires were applied.  The patient transiently required atrial pacing to increase rate.  Sites of anastomosis were free of bleeding, was rewarmed to 37 degrees.  He was then ventilated and weaned with cardiopulmonary bypass without difficulty.  Remained hemodynamically stable.  He was decannulated in usual fashion.  Protamine sulfate was administered with operative field hemostatic.  Two atrial tube ventricular pacing wires had been applied.  Left pleural tube and Blake mediastinal drain left in place.  Sternum was closed with #6 stainless steel wire.  Fascia closed with interrupted 0 Vicryl, running 3-0 Vicryl in subcutaneous tissue, 3-0 subcuticular stitch in the skin edges.  Dry dressings were applied.  Sponge and needle count was reported as correct at the completion of procedure.  The patient tolerated the procedure without obvious complication, was transferred to surgical intensive care unit for further postoperative care.     Sheliah Plane, MD     EG/MEDQ  D:  10/21/2010  T:  10/21/2010  Job:  [id number]  cc:   Thomas C. Daleen Squibb, MD, [facility]  Electronically Signed by Sheliah Plane MD on 11/02/2010 07:31:44 AM

## 2010-11-02 NOTE — Discharge Summary (Signed)
NAME:  Mathew Clements, Mathew Clements NO.:  [id number]  MEDICAL RECORD NO.:  [id number]  LOCATION:  2001                         FACILITY:  [facility]  PHYSICIAN:  Mathew Plane, MD    DATE OF BIRTH:  [birth year]  DATE OF ADMISSION:  10/14/2010 DATE OF DISCHARGE:  10/25/2010                              DISCHARGE SUMMARY   ADMITTING DIAGNOSES: 1. Multi-vessel coronary artery disease (with an ejection fraction of     65%). 2. Status post non-STEMI. 3. History of hypertension. 4. History of diabetes mellitus.  DISCHARGE DIAGNOSES: 1. Multivessel coronary artery disease (with an ejection fraction of     65%). 2. Status post non-STEMI. 3. History of hypertension. 4. History of diabetes mellitus. 5. Acute blood loss anemia.  PROCEDURES: 1. Cardiac catheterization performed by Dr. Antoine Clements on October 15, 2010. 2. Median sternotomy for CABG x3 (LIMA to LAD, SVG to diagonal 1, SVG     to posterior descending coronary artery with EVH of the left thigh     by Dr. Tyrone Clements on October 20, 2010).  HISTORY OF PRESENT ILLNESS:  This is a 65 year old Caucasian male with the aforementioned past medical history who presented to [facility] on October 14, 2010, with complaints of acute onset of chest discomfort at rest.  According to medical records, the patient was lying in bed when began to feel chest pressure as well as shortness of breath.  This shortness of breath did improve when he sat up right; however, his chest pain continued.  EMS was summoned and he was transported to [facility] Emergency Room for further evaluation treatment.  He was initially given 2 sublingual nitroglycerin as well as morphine and his chest discomfort did abate.  Cardiac enzymes done initially showed a troponin of 0.3 which later did increase to 1.41 and a CK-MB went as high as 10.4.  He was stabilized medically, loaded with Plavix and then underwent a cardiac catheterization with Dr. Antoine Clements on October 15, 2010.   Results indicated an EF of 65% with normal wall motion, the LAD had a long proximal 80% stenosis.  There was also disease in the first diagonal of approximately 50% and his superior branch had a proximal 75% stenosis. The OM1 was small and had a 99% ostial stenosis, the OM 2 was moderate size and had a 95% stenosis.  The PDA had 60% ostial stenosis followed by mid 75% stenosis.  Initially, the patient was being considered for possible angioplasty, however, was ultimately decided to obtain a cardiothoracic consultation with Dr. Tyrone Clements for consideration of coronary bypass grafting surgery.  Preoperative carotid duplex, carotid ultrasound showed no evidence of significant internal carotid artery stenosis bilaterally.  Potential risks, complications, and benefits of the surgery discussed with the patient.  He agreed to proceed. After several days of a plavix "washout" period,  he underwent CABG x3 on October 20, 2010.  BRIEF HOSPITAL COURSE STAY:  The patient was extubated late afternoon the day of surgery without difficulty.  His Swan-Ganz, A-line, chest tubes and Foley were all removed early in his postoperative course.  He was started on low-dose beta-blocker, then he developed sinus tachycardia and his beta-blocker was increased.  He was weaned off his insulin drip and started on metformin 500 mg p.o. 2 times daily and his Lantus was continued at bedtime.  It should be noted that apparently the patient was prescribed metformin for recently diagnosed diabetes; however, he has not been taking this.  His glucose has been monitored closely while in the hospital and we may need to increase his metformin prior to discharge for better gluecose control. We will continue to assess. The patient was found to have acute blood loss anemia.  His H and H went to low as 10.9 and 31.5.  He did not require a postoperative transfusion.  He was felt surgically stable for discharge from the Intensive Care Unit  to 2000 further convalescence on October 22, 2009.  Currently, on postop day #4, the patient has been tolerating a diet.  He has had a bowel movement. His vital signs are as follows, T max 99, heart rates are 90s-110, BP 144/83, O2 sat 96% on room air.  Preop weight is 94 kg, today's weight is unknown.  CBGs 121, 127, 148.  PHYSICAL EXAMINATION:  CARDIOVASCULAR:  Tachycardic. PULMONARY:  Clear. ABDOMEN:  Soft, nontender.  Bowel sounds present. EXTREMITIES:  Mild lower extremity edema.  Wounds are clean and dry.  We are going to increase the patient's Lopressor to 25 mg p.o. 3 times daily for better BP and heart rate control.  He will be reevaluated in the morning. Provided he remains afebrile, hemodynamically stable and pending morning round evaluation will be surgically stable for discharge on October 25, 2010.  Please note, the patient's epicardial pacing wires and chest tube sutures will be removed prior to his discharge.  Latest laboratory studies are as follows:  BMET done on October 23, 2010, potassium 3.9, sodium 134, BUN and creatinine 17 and 0.74 respectively. CBC on this date, H and H 10.9 and 31.5 respectively, platelet count 199,000, white blood cell count down to 15,400.  Last chest x-ray done on October 23, 2010, showed no pneumothorax, improved aeration of the lung bases, mild bibasilar atelectasis and small bilateral pleural effusions.  DISCHARGE INSTRUCTIONS INCLUDE THE FOLLOWING.:  DIET:  Low-sodium heart-healthy diabetic diet.  ACTIVITY:  The patient may walk up steps.  He may shower.  He is not to lift more than 10 pounds for 4 weeks or drive until after 4 weeks.  He is to continue his breathing exercise daily.  He is to walk daily and increase frequency and duration as tolerates.  WOUND CARE:  He is to use soap and water on his wounds and he is to contact the office if any wound problems arise.  FOLLOWUP APPOINTMENTS:  Include 1. The patient needs to contact Dr.  Jenene Clements office for follow up     appointment in 2 weeks. 2. The patient needs to call for follow up appointment with Dr. Azucena Clements     regarding further diabetes management. 3. The patient will be contacted by Dr. Dennie Maizes office for follow     up appointment in 3 weeks and a chest x-ray will be obtained 45     minutes prior to this office appointment.  DISCHARGE MEDICATIONS:  Include the following: 1. Lopressor 25 mg p.o. 3 times daily. 2. Metformin 500 mg p.o. 2 times daily. 3. Lasix 40 mg p.o. daily x4 days. 4. Potassium chloride 20 mEq p.o. daily x4 days. 5. Oxycodone 1-5 mg IR 1-2 tablets p.o. q.4-6 hours p.r.n. pain. 6. Crestor 20 mg p.o. at bedtime. 7. Enteric-coated aspirin  325 mg p.o. daily. 8. Folic acid 1 mg p.o. daily.     Doree Fudge, PA   ______________________________ Mathew Plane, MD    DZ/MEDQ  D:  10/24/2010  T:  10/24/2010  Job:  [id number]  cc:   Rollene Rotunda, MD, [facility] Tally Joe, M.D.  Electronically Signed by Doree Fudge PA on 10/25/2010 11:35:23 AM Electronically Signed by Mathew Plane MD on 11/02/2010 07:31:37 AM

## 2010-11-09 NOTE — Discharge Summary (Signed)
  NAME:  Mathew Clements, Mathew Clements NO.:  [id number]  MEDICAL RECORD NO.:  [id number]  LOCATION:  2001                         FACILITY:  [facility]  PHYSICIAN:  Sheliah Plane, MD    DATE OF BIRTH:  22-Jun-1945  DATE OF ADMISSION:  10/14/2010 DATE OF DISCHARGE:  10/25/2010                              DISCHARGE SUMMARY   ADDENDUM  BRIEF HOSPITAL COURSE STAY SINCE LAST DICTATION:  The patient remains afebrile, heart rate high 90s-100s, BP 151/89, and O2 sat 95% on room air.  Preop weight 94 kg, today's weight down to 92.4 kg.  CBGs 126/115/160.  No change on physical examination.  Because the patient remains tachycardic and hypertensive, I am going to increase his Lopressor to 50 mg p.o. 2 times daily and add a low-dose ACE inhibitor (as the patient has had a non-STEMI on this admission).  He will be given lisinopril 10 mg p.o. daily.  In addition, the patient's metformin has been increased to 850 mg p.o. 2 times daily and he needs to follow up with a medical doctor as an outpatient regarding further diabetes management.     Doree Fudge, PA   ______________________________ Sheliah Plane, MD    DZ/MEDQ  D:  10/25/2010  T:  10/25/2010  Job:  [id number]  cc:   Rollene Rotunda, MD, [facility] [employer], NP  Electronically Signed by Doree Fudge PA on 11/02/2010 09:47:11 AM Electronically Signed by Sheliah Plane MD on 11/09/2010 02:21:45 PM
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
## 2010-11-13 DIAGNOSIS — Z0279 Encounter for issue of other medical certificate: Secondary | ICD-10-CM

## 2010-11-17 ENCOUNTER — Other Ambulatory Visit: Payer: Self-pay | Admitting: Cardiothoracic Surgery

## 2010-11-17 DIAGNOSIS — I2510 Atherosclerotic heart disease of native coronary artery without angina pectoris: Secondary | ICD-10-CM

## 2010-11-18 DIAGNOSIS — Z87442 Personal history of urinary calculi: Secondary | ICD-10-CM

## 2010-11-18 DIAGNOSIS — I2510 Atherosclerotic heart disease of native coronary artery without angina pectoris: Secondary | ICD-10-CM

## 2010-11-18 DIAGNOSIS — D649 Anemia, unspecified: Secondary | ICD-10-CM | POA: Insufficient documentation

## 2010-11-18 DIAGNOSIS — I10 Essential (primary) hypertension: Secondary | ICD-10-CM | POA: Insufficient documentation

## 2010-11-18 DIAGNOSIS — Z8669 Personal history of other diseases of the nervous system and sense organs: Secondary | ICD-10-CM

## 2010-11-18 DIAGNOSIS — E119 Type 2 diabetes mellitus without complications: Secondary | ICD-10-CM | POA: Insufficient documentation

## 2010-11-19 ENCOUNTER — Ambulatory Visit (INDEPENDENT_AMBULATORY_CARE_PROVIDER_SITE_OTHER): Payer: Self-pay | Admitting: Cardiothoracic Surgery

## 2010-11-19 ENCOUNTER — Ambulatory Visit
Admission: RE | Admit: 2010-11-19 | Discharge: 2010-11-19 | Disposition: A | Discharge status: Home / Self Care | Payer: BC Managed Care – PPO | Source: Ambulatory Visit | Attending: Cardiothoracic Surgery | Admitting: Cardiothoracic Surgery

## 2010-11-19 ENCOUNTER — Encounter: Payer: Self-pay | Admitting: Cardiothoracic Surgery

## 2010-11-19 VITALS — BP 114/73 | HR 80 | Resp 18 | Ht 69.0 in | Wt 196.0 lb

## 2010-11-19 DIAGNOSIS — I2510 Atherosclerotic heart disease of native coronary artery without angina pectoris: Secondary | ICD-10-CM

## 2010-11-19 DIAGNOSIS — Z951 Presence of aortocoronary bypass graft: Secondary | ICD-10-CM

## 2010-11-19 NOTE — Patient Instructions (Signed)
No lifting over 25 lbs for 3 months May drive if not taking pain pills Go to Cardiac Rehab,

## 2010-11-19 NOTE — Progress Notes (Signed)
  HPI  Patient returns for routine postoperative follow-up having undergone   SURGICAL PROCEDURES: Coronary artery bypass grafting x3 with the left  internal mammary to the left anterior descending coronary artery,  reverse saphenous vein graft to the first diagonal coronary artery, and  reverse saphenous vein graft to the posterior descending coronary  artery, with left thigh endovein harvesting. 10/20/10    Pre op HbA1c was 7.7 preop  Since hospital discharge the patient reports doing well, walking twice a day 30-40 min each time Never smoking Working on glucose control  Current Outpatient Prescriptions  Medication Sig Dispense Refill  . aspirin 325 MG EC tablet Take 325 mg by mouth daily.        . folic acid (FOLVITE) 1 MG tablet Take 1 mg by mouth daily.        Marland Kitchen lisinopril (PRINIVIL,ZESTRIL) 10 MG tablet Take 10 mg by mouth daily.        . metFORMIN (GLUCOPHAGE) 850 MG tablet Take 850 mg by mouth 2 (two) times daily with a meal.        . metoprolol tartrate (LOPRESSOR) 25 MG tablet Take 50 mg by mouth 2 (two) times daily.       Marland Kitchen oxycodone (OXY-IR) 5 MG capsule Take 5 mg by mouth every 4 (four) hours as needed.        . rosuvastatin (CRESTOR) 20 MG tablet Take 20 mg by mouth daily.            Physical Exam BP 114/73  Pulse 80  Resp 18  Ht 5\' 9"  (1.753 m)  Wt 196 lb (88.905 kg)  BMI 28.94 kg/m2  SpO2 99% Patient is very pleasant and in no acute distress. Skin is warm and dry. Color is normal.  HEENT is unremarkable. Normocephalic/atraumatic. PERRL. Sclera are nonicteric. Neck is supple. No masses. No JVD. Lungs are clear. Cardiac exam shows a regular rate and rhythm. Sternum is well healed.Abdomen is soft. Extremities are without edema.Left endo vein site healed well. Gait and ROM are intact. No gross neurologic deficits noted.   Diagnostic tests: CHEST - 2 VIEW  Comparison: Chest x-ray 10/23/2010.  Findings: The cardiac silhouette, mediastinal and hilar contours  are  normal. Much improved basilar aeration. No pleural effusion.  The bony thorax is intact.  IMPRESSION:  No acute cardiopulmonary findings.    Impression:  Stable Post op  Plan:  Postop Op working on glucose control

## 2010-11-20 ENCOUNTER — Ambulatory Visit (INDEPENDENT_AMBULATORY_CARE_PROVIDER_SITE_OTHER): Payer: BC Managed Care – PPO | Admitting: Cardiology

## 2010-11-20 ENCOUNTER — Encounter: Payer: Self-pay | Admitting: Cardiology

## 2010-11-20 DIAGNOSIS — E785 Hyperlipidemia, unspecified: Secondary | ICD-10-CM | POA: Insufficient documentation

## 2010-11-20 DIAGNOSIS — I2510 Atherosclerotic heart disease of native coronary artery without angina pectoris: Secondary | ICD-10-CM

## 2010-11-20 DIAGNOSIS — E669 Obesity, unspecified: Secondary | ICD-10-CM | POA: Insufficient documentation

## 2010-11-20 DIAGNOSIS — E119 Type 2 diabetes mellitus without complications: Secondary | ICD-10-CM

## 2010-11-20 DIAGNOSIS — I10 Essential (primary) hypertension: Secondary | ICD-10-CM

## 2010-11-20 NOTE — Progress Notes (Signed)
HPI The patient presents for follow up after CABG.  Since discharge he has done very well.  He has mild chest soreness and a slight cough.  Otherwise, he patient denies any new symptoms such as chest discomfort, neck or arm discomfort. There has been no new shortness of breath, PND or orthopnea. There have been no reported palpitations, presyncope or syncope.  No Known Allergies  Current Outpatient Prescriptions  Medication Sig Dispense Refill  . aspirin 325 MG EC tablet Take 325 mg by mouth daily.        . folic acid (FOLVITE) 1 MG tablet Take 1 mg by mouth daily.        . metFORMIN (GLUCOPHAGE) 850 MG tablet Take 850 mg by mouth 2 (two) times daily with a meal.        . metoprolol (LOPRESSOR) 50 MG tablet Take 50 mg by mouth 2 (two) times daily.        Marland Kitchen oxycodone (OXY-IR) 5 MG capsule Take 5 mg by mouth every 4 (four) hours as needed.        . rosuvastatin (CRESTOR) 20 MG tablet Take 20 mg by mouth daily.          Past Medical History  Diagnosis Date  . HTN (hypertension)   . Diabetes mellitus   . Anemia   . CAD (coronary artery disease)     with an ejection fraction of 65%  . History of kidney stones   . H/O: Bell's palsy     Past Surgical History  Procedure Date  . Cardiac catheterization 10/15/2010    Dr Antoine Poche  . Coronary artery bypass graft 10/20/2010    x 3, Dr Tyrone Sage    ROS:  As stated in the HPI and negative for all other systems.  PHYSICAL EXAM BP 128/70  Pulse 77  Ht 5' 9.5" (1.765 m)  Wt 201 lb (91.173 kg)  BMI 29.26 kg/m2 GENERAL:  Well appearing HEENT:  Pupils equal round and reactive, fundi not visualized, oral mucosa unremarkable NECK:  No jugular venous distention, waveform within normal limits, carotid upstroke brisk and symmetric, no bruits, no thyromegaly LYMPHATICS:  No cervical, inguinal adenopathy LUNGS:  Clear to auscultation bilaterally BACK:  No CVA tenderness CHEST:  Well healed sternotomy scar HEART:  PMI not displaced or sustained,S1  and S2 within normal limits, no S3, no S4, no clicks, no rubs, no murmurs ABD:  Flat, positive bowel sounds normal in frequency in pitch, no bruits, no rebound, no guarding, no midline pulsatile mass, no hepatomegaly, no splenomegaly EXT:  2 plus pulses throughout, no edema, no cyanosis no clubbing SKIN:  No rashes no nodules NEURO:  Cranial nerves II through XII grossly intact, motor grossly intact throughout PSYCH:  Cognitively intact, oriented to person place and time   EKG:  Sinus rhythm, rate 77, axis within normal limits, intervals within normal limits, lateral T-wave inversion, possible left atrial enlargement.  ASSESSMENT AND PLAN

## 2010-11-20 NOTE — Assessment & Plan Note (Signed)
He is doing well post bypass. He will continue the medications as listed. He is going to be dissipating cardiac rehabilitation.

## 2010-11-20 NOTE — Patient Instructions (Signed)
Please follow up with Dr Antoine Poche in 4 months  The current medical regimen is effective;  continue present plan and medications.

## 2010-11-20 NOTE — Assessment & Plan Note (Signed)
The blood pressure is at target. No change in medications is indicated. We will continue with therapeutic lifestyle changes (TLC).  

## 2010-11-20 NOTE — Assessment & Plan Note (Signed)
I will ask him to get a lipid profile in early Oct.  The goal is an LDL less than 70 and HDL greater than 40.

## 2010-11-20 NOTE — Assessment & Plan Note (Signed)
The patient understands the need to lose weight with diet and exercise. We have discussed specific strategies for this.  

## 2010-11-20 NOTE — Assessment & Plan Note (Signed)
I emphasized the importance of diabetes controlled per Sissy Hoff, MD

## 2010-11-25 ENCOUNTER — Encounter: Payer: BC Managed Care – PPO | Attending: Family Medicine | Admitting: *Deleted

## 2010-11-25 ENCOUNTER — Encounter: Payer: Self-pay | Admitting: *Deleted

## 2010-11-25 DIAGNOSIS — E119 Type 2 diabetes mellitus without complications: Secondary | ICD-10-CM | POA: Insufficient documentation

## 2010-11-25 DIAGNOSIS — Z713 Dietary counseling and surveillance: Secondary | ICD-10-CM | POA: Insufficient documentation

## 2010-11-25 NOTE — Progress Notes (Signed)
  Medical Nutrition Therapy:  Appt start time: 1120 end time:  1300.   Assessment:  Primary concerns today: diabetes management. Mathew Clements is here today s/p discharge from hospital s/p CAGB. He is here today with wife and daughter for education on DM diet and blood glucose monitoring. 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  MEDICATIONS: See medication list. Mathew Clements DM medication include Metformin (1700 mg/day).   DIETARY INTAKE:  Usual eating pattern includes 2-3 meals and 0-1 snacks per day.  CBG monitoring: None at this time; pt does not have a meter CBG levels: N/A  Lab Results  Component Value Date   HGBA1C 7.4* 10/19/2010   Meter given: Accucheck Nano Lot number: [id number] Exp date: 02/19/12  24-hr recall:  B (6:30 AM): cereal w/ milk OR grits OR [employer] sausage biscuit  Snk (AM): No snack  L (12-3 PM): McDonald's double cheeseburger (2) OR Taco salad w/ shell Snk (PM): No snack D (6-9 PM): Beans OR Ham sandwich OR Meat (chicken or beef), green beans, instant potatoes Snk (PM): bag of popcorn OR fruit cup  Beverages: water, Gatorade, orange juice, sodas (regular), sweet tea, coffee w/ sugar, chocolate milk  *Pt reports that he is not cognitive of portion sizes  Usual physical activity: Pt reports that he has a very strenuous job where he works >10 hrs/day working on [employer] where he walks, goes up and down ladders, and completes heavy lifting  Estimated energy needs: 1700-1900 calories <215 g carbohydrates 100-115 g protein 50-60 g fat  Progress Towards Goal(s):  In progress.   Nutritional Diagnosis:  [place]-2.1 Inpaired nutrition utilization As related to strong family hx for DM and excessive carbohydrate intake.  As evidenced by elevated glucose levels and HgbA1c >7%.    Intervention:  Nutrition education.  Handouts given during visit include:  ADA's "What's Your Number" A1C handout  Living Well with Diabetes by  [employer]  Sugar-sweetened beverages sheet  Plate Method Handout  Monitoring/Evaluation:  Dietary intake, exercise, glucose levels, and body weight prn.

## 2010-11-25 NOTE — Patient Instructions (Signed)
Goals:  Follow Diabetes Meal Plan as instructed  Diabetes Plate Method  Eat 3 meals and snacks, every 3-5 hrs  Avoid concentrated sweets (sodas, sweet tea, candy bars)  Add lean protein foods to meals/snacks  Monitor glucose levels as instructed by your doctor  Aim for 15-30 mins of physical activity daily  Bring food record and glucose log to your next nutrition visit

## 2010-12-07 ENCOUNTER — Other Ambulatory Visit: Payer: Self-pay | Admitting: Cardiology

## 2010-12-07 ENCOUNTER — Encounter (HOSPITAL_COMMUNITY)
Admission: RE | Admit: 2010-12-07 | Discharge: 2010-12-07 | Disposition: A | Discharge status: Home / Self Care | Payer: BC Managed Care – PPO | Source: Ambulatory Visit | Attending: Cardiology | Admitting: Cardiology

## 2010-12-07 DIAGNOSIS — Z5189 Encounter for other specified aftercare: Secondary | ICD-10-CM | POA: Insufficient documentation

## 2010-12-07 DIAGNOSIS — E119 Type 2 diabetes mellitus without complications: Secondary | ICD-10-CM | POA: Insufficient documentation

## 2010-12-07 DIAGNOSIS — Z8249 Family history of ischemic heart disease and other diseases of the circulatory system: Secondary | ICD-10-CM | POA: Insufficient documentation

## 2010-12-07 DIAGNOSIS — I2510 Atherosclerotic heart disease of native coronary artery without angina pectoris: Secondary | ICD-10-CM | POA: Insufficient documentation

## 2010-12-07 DIAGNOSIS — I214 Non-ST elevation (NSTEMI) myocardial infarction: Secondary | ICD-10-CM | POA: Insufficient documentation

## 2010-12-07 DIAGNOSIS — I498 Other specified cardiac arrhythmias: Secondary | ICD-10-CM | POA: Insufficient documentation

## 2010-12-07 DIAGNOSIS — Z7982 Long term (current) use of aspirin: Secondary | ICD-10-CM | POA: Insufficient documentation

## 2010-12-07 DIAGNOSIS — I10 Essential (primary) hypertension: Secondary | ICD-10-CM | POA: Insufficient documentation

## 2010-12-07 DIAGNOSIS — Z951 Presence of aortocoronary bypass graft: Secondary | ICD-10-CM | POA: Insufficient documentation

## 2010-12-07 LAB — GLUCOSE, CAPILLARY
Glucose-Capillary: 159 mg/dL — ABNORMAL HIGH (ref 70–99)
Glucose-Capillary: 111 mg/dL — ABNORMAL HIGH (ref 70–99)

## 2010-12-09 ENCOUNTER — Other Ambulatory Visit: Payer: Self-pay | Admitting: Cardiology

## 2010-12-09 ENCOUNTER — Encounter (HOSPITAL_COMMUNITY): Payer: BC Managed Care – PPO

## 2010-12-09 LAB — GLUCOSE, CAPILLARY
Glucose-Capillary: 151 mg/dL — ABNORMAL HIGH (ref 70–99)
Glucose-Capillary: 94 mg/dL (ref 70–99)

## 2010-12-11 ENCOUNTER — Other Ambulatory Visit: Payer: Self-pay | Admitting: Cardiology

## 2010-12-11 ENCOUNTER — Encounter (HOSPITAL_COMMUNITY): Payer: BC Managed Care – PPO

## 2010-12-14 ENCOUNTER — Encounter (HOSPITAL_COMMUNITY): Payer: BC Managed Care – PPO

## 2010-12-14 ENCOUNTER — Other Ambulatory Visit: Payer: Self-pay | Admitting: Cardiology

## 2010-12-16 ENCOUNTER — Other Ambulatory Visit: Payer: Self-pay | Admitting: Cardiology

## 2010-12-16 ENCOUNTER — Encounter (HOSPITAL_COMMUNITY): Payer: BC Managed Care – PPO

## 2010-12-16 LAB — GLUCOSE, CAPILLARY: Glucose-Capillary: 114 mg/dL — ABNORMAL HIGH (ref 70–99)

## 2010-12-18 ENCOUNTER — Encounter (HOSPITAL_COMMUNITY): Payer: BC Managed Care – PPO

## 2010-12-18 ENCOUNTER — Other Ambulatory Visit: Payer: Self-pay | Admitting: Cardiology

## 2010-12-21 ENCOUNTER — Encounter (HOSPITAL_COMMUNITY): Payer: BC Managed Care – PPO | Attending: Cardiology

## 2010-12-21 DIAGNOSIS — Z951 Presence of aortocoronary bypass graft: Secondary | ICD-10-CM | POA: Insufficient documentation

## 2010-12-21 DIAGNOSIS — I498 Other specified cardiac arrhythmias: Secondary | ICD-10-CM | POA: Insufficient documentation

## 2010-12-21 DIAGNOSIS — I2510 Atherosclerotic heart disease of native coronary artery without angina pectoris: Secondary | ICD-10-CM | POA: Insufficient documentation

## 2010-12-21 DIAGNOSIS — I214 Non-ST elevation (NSTEMI) myocardial infarction: Secondary | ICD-10-CM | POA: Insufficient documentation

## 2010-12-21 DIAGNOSIS — Z8249 Family history of ischemic heart disease and other diseases of the circulatory system: Secondary | ICD-10-CM | POA: Insufficient documentation

## 2010-12-21 DIAGNOSIS — Z5189 Encounter for other specified aftercare: Secondary | ICD-10-CM | POA: Insufficient documentation

## 2010-12-21 DIAGNOSIS — E119 Type 2 diabetes mellitus without complications: Secondary | ICD-10-CM | POA: Insufficient documentation

## 2010-12-21 DIAGNOSIS — I10 Essential (primary) hypertension: Secondary | ICD-10-CM | POA: Insufficient documentation

## 2010-12-21 DIAGNOSIS — Z7982 Long term (current) use of aspirin: Secondary | ICD-10-CM | POA: Insufficient documentation

## 2010-12-22 ENCOUNTER — Telehealth: Payer: Self-pay | Admitting: Cardiology

## 2010-12-22 NOTE — Telephone Encounter (Signed)
I agree with Dr. Dennie Maizes date of return.

## 2010-12-22 NOTE — Telephone Encounter (Signed)
Pt's wife called. Pt is wanting to go back to work and she wants to know what restrictions he will have. Please call

## 2010-12-22 NOTE — Telephone Encounter (Signed)
Spoke with Mathew Clements who feels he is ready to go back to work when Dr Antoine Poche says it's OK.  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  Mathew Clements states Dr Tyrone Sage told him he could return to work after 01/12/2011.  Will discuss with MD and let Mathew Clements know of recommendations.

## 2010-12-23 ENCOUNTER — Encounter: Payer: Self-pay | Admitting: *Deleted

## 2010-12-23 ENCOUNTER — Encounter (HOSPITAL_COMMUNITY): Payer: BC Managed Care – PPO

## 2010-12-23 NOTE — Telephone Encounter (Signed)
Per Dr Antoine Poche OK for pt to return to work 01/12/11 with a restriction of 25 lb wt for one week then no restriction.  Pt aware and a letter of such will be mailed to pts home address.

## 2010-12-25 ENCOUNTER — Encounter (HOSPITAL_COMMUNITY): Payer: BC Managed Care – PPO

## 2010-12-28 ENCOUNTER — Encounter (HOSPITAL_COMMUNITY): Payer: BC Managed Care – PPO

## 2010-12-30 ENCOUNTER — Encounter (HOSPITAL_COMMUNITY): Payer: BC Managed Care – PPO

## 2011-01-01 ENCOUNTER — Encounter (HOSPITAL_COMMUNITY): Payer: BC Managed Care – PPO

## 2011-01-04 ENCOUNTER — Telehealth: Payer: Self-pay | Admitting: Cardiology

## 2011-01-04 ENCOUNTER — Encounter (HOSPITAL_COMMUNITY): Payer: BC Managed Care – PPO

## 2011-01-04 NOTE — Telephone Encounter (Signed)
Pt wants to know if can get a letter to go back to work on [phone] please call

## 2011-01-04 NOTE — Telephone Encounter (Signed)
Wife understands that pt will be able to return to work on 01/12/11 and restrictions.  They did receive the letter Pam sent earlier this month.  Mylo Red RN

## 2011-01-06 ENCOUNTER — Telehealth: Payer: Self-pay | Admitting: Cardiology

## 2011-01-06 ENCOUNTER — Encounter (HOSPITAL_COMMUNITY): Payer: BC Managed Care – PPO

## 2011-01-06 NOTE — Telephone Encounter (Signed)
Walk In pt Form " Pt Dropped Off [employer] [place]" paper for completionsent to Pam/Hochrein  01/06/11/klm

## 2011-01-08 ENCOUNTER — Encounter (HOSPITAL_COMMUNITY): Payer: BC Managed Care – PPO

## 2011-01-11 ENCOUNTER — Encounter (HOSPITAL_COMMUNITY): Payer: BC Managed Care – PPO

## 2011-01-12 ENCOUNTER — Telehealth: Payer: Self-pay | Admitting: Cardiology

## 2011-01-12 NOTE — Telephone Encounter (Signed)
Called Pt will be picking up Today. 01/12/11/km

## 2011-01-12 NOTE — Telephone Encounter (Signed)
Paperwork completed and signed.  Taken to Medical Records to be scanned.

## 2011-01-12 NOTE — Telephone Encounter (Signed)
Calling re paperwork to be filled out be dr Antoine Poche, pt's wife to be called when ready and not heard from [place], checking on status

## 2011-01-13 ENCOUNTER — Encounter (HOSPITAL_COMMUNITY): Payer: BC Managed Care – PPO

## 2011-01-15 ENCOUNTER — Encounter (HOSPITAL_COMMUNITY): Payer: BC Managed Care – PPO

## 2011-01-18 ENCOUNTER — Encounter (HOSPITAL_COMMUNITY): Payer: BC Managed Care – PPO

## 2011-01-20 ENCOUNTER — Encounter (HOSPITAL_COMMUNITY): Payer: BC Managed Care – PPO

## 2011-01-22 ENCOUNTER — Encounter (HOSPITAL_COMMUNITY): Payer: BC Managed Care – PPO

## 2011-01-25 ENCOUNTER — Encounter (HOSPITAL_COMMUNITY): Payer: BC Managed Care – PPO

## 2011-01-27 ENCOUNTER — Encounter (HOSPITAL_COMMUNITY): Payer: BC Managed Care – PPO

## 2011-01-29 ENCOUNTER — Encounter (HOSPITAL_COMMUNITY): Payer: BC Managed Care – PPO

## 2011-02-01 ENCOUNTER — Encounter (HOSPITAL_COMMUNITY): Payer: BC Managed Care – PPO

## 2011-02-03 ENCOUNTER — Encounter (HOSPITAL_COMMUNITY): Payer: BC Managed Care – PPO

## 2011-02-04 ENCOUNTER — Ambulatory Visit: Payer: BC Managed Care – PPO | Admitting: *Deleted

## 2011-02-05 ENCOUNTER — Encounter (HOSPITAL_COMMUNITY): Payer: BC Managed Care – PPO

## 2011-02-08 ENCOUNTER — Encounter (HOSPITAL_COMMUNITY): Payer: BC Managed Care – PPO

## 2011-02-10 ENCOUNTER — Encounter (HOSPITAL_COMMUNITY): Payer: BC Managed Care – PPO

## 2011-02-12 ENCOUNTER — Encounter (HOSPITAL_COMMUNITY): Payer: BC Managed Care – PPO

## 2011-02-15 ENCOUNTER — Encounter (HOSPITAL_COMMUNITY): Payer: BC Managed Care – PPO

## 2011-02-17 ENCOUNTER — Encounter (HOSPITAL_COMMUNITY): Payer: BC Managed Care – PPO

## 2011-02-19 ENCOUNTER — Encounter (HOSPITAL_COMMUNITY): Payer: BC Managed Care – PPO

## 2011-02-22 ENCOUNTER — Encounter (HOSPITAL_COMMUNITY): Payer: BC Managed Care – PPO

## 2011-02-24 ENCOUNTER — Encounter (HOSPITAL_COMMUNITY): Payer: BC Managed Care – PPO

## 2011-02-26 ENCOUNTER — Encounter (HOSPITAL_COMMUNITY): Payer: BC Managed Care – PPO

## 2011-03-01 ENCOUNTER — Encounter (HOSPITAL_COMMUNITY): Payer: BC Managed Care – PPO

## 2011-03-03 ENCOUNTER — Encounter (HOSPITAL_COMMUNITY): Payer: BC Managed Care – PPO

## 2011-03-05 ENCOUNTER — Encounter (HOSPITAL_COMMUNITY): Payer: BC Managed Care – PPO

## 2011-03-08 ENCOUNTER — Encounter (HOSPITAL_COMMUNITY): Payer: BC Managed Care – PPO

## 2011-03-08 ENCOUNTER — Ambulatory Visit (INDEPENDENT_AMBULATORY_CARE_PROVIDER_SITE_OTHER): Payer: BC Managed Care – PPO | Admitting: Cardiology

## 2011-03-08 ENCOUNTER — Encounter: Payer: Self-pay | Admitting: Cardiology

## 2011-03-08 DIAGNOSIS — I10 Essential (primary) hypertension: Secondary | ICD-10-CM

## 2011-03-08 DIAGNOSIS — I2510 Atherosclerotic heart disease of native coronary artery without angina pectoris: Secondary | ICD-10-CM

## 2011-03-08 DIAGNOSIS — E785 Hyperlipidemia, unspecified: Secondary | ICD-10-CM

## 2011-03-08 NOTE — Progress Notes (Signed)
HPI The patient presents for follow up after CABG. Since I last saw him he has done well.  The patient denies any new symptoms such as chest discomfort, neck or arm discomfort. There has been no new shortness of breath, PND or orthopnea. There have been no reported palpitations, presyncope or syncope.  He isn't exercising but he has an active job.  No Known Allergies  Current Outpatient Prescriptions  Medication Sig Dispense Refill  . aspirin 325 MG EC tablet Take 325 mg by mouth daily.        . metFORMIN (GLUCOPHAGE) 850 MG tablet Take 850 mg by mouth 2 (two) times daily with a meal.       . metoprolol (LOPRESSOR) 50 MG tablet Take 50 mg by mouth 2 (two) times daily.        . rosuvastatin (CRESTOR) 20 MG tablet Take 20 mg by mouth daily.          Past Medical History  Diagnosis Date  . HTN (hypertension)   . Diabetes mellitus   . Anemia   . CAD (coronary artery disease)     with an ejection fraction of 65%  . History of kidney stones   . H/O: Bell's palsy     Past Surgical History  Procedure Date  . Cardiac catheterization 10/15/2010    Dr Antoine Poche  . Coronary artery bypass graft 10/20/2010    x 3, Dr Tyrone Sage    ROS:  As stated in the HPI and negative for all other systems.  PHYSICAL EXAM BP 140/76  Pulse 64  Ht 5' 10.5" (1.791 m)  Wt 200 lb (90.719 kg)  BMI 28.29 kg/m2 GENERAL:  Well appearing HEENT:  Pupils equal round and reactive, fundi not visualized, oral mucosa unremarkable, edentulous NECK:  No jugular venous distention, waveform within normal limits, carotid upstroke brisk and symmetric, no bruits, no thyromegaly LYMPHATICS:  No cervical, inguinal adenopathy LUNGS:  Clear to auscultation bilaterally BACK:  No CVA tenderness CHEST:  Well healed sternotomy scar HEART:  PMI not displaced or sustained,S1 and S2 within normal limits, no S3, no S4, no clicks, no rubs, no murmurs ABD:  Flat, positive bowel sounds normal in frequency in pitch, no bruits, no rebound,  no guarding, no midline pulsatile mass, no hepatomegaly, no splenomegaly EXT:  2 plus pulses throughout, no edema, no cyanosis no clubbing SKIN:  No rashes no nodules NEURO:  Cranial nerves II through XII grossly intact, motor grossly intact throughout PSYCH:  Cognitively intact, oriented to person place and time  EKG:  Sinus rhythm, rate 64, poor anterior R wave progression, no acute ST-T wave changes 03/08/2011  ASSESSMENT AND PLAN

## 2011-03-08 NOTE — Assessment & Plan Note (Signed)
His blood pressure is upper limits of normal. We discussed this as well as therapeutic lifestyle changes. For now I will not change his medications. I think a few pounds of weight loss would be good to get him to be at a more optimal level.

## 2011-03-08 NOTE — Assessment & Plan Note (Signed)
The patient has no new sypmtoms.  No further cardiovascular testing is indicated.  We will continue with aggressive risk reduction and meds as listed.  

## 2011-03-08 NOTE — Patient Instructions (Signed)
Continue current medications as listed.  Follow up in 1 year with Dr Hochrein.  You will receive a letter in the mail 2 months before you are due.  Please call us when you receive this letter to schedule your follow up appointment.  

## 2011-03-08 NOTE — Assessment & Plan Note (Signed)
He has this followed by Sissy Hoff, MD.  I will defer this to him.

## 2011-03-10 ENCOUNTER — Encounter (HOSPITAL_COMMUNITY): Payer: BC Managed Care – PPO

## 2011-03-12 ENCOUNTER — Encounter (HOSPITAL_COMMUNITY): Payer: BC Managed Care – PPO

## 2012-03-03 ENCOUNTER — Ambulatory Visit: Payer: BC Managed Care – PPO | Admitting: Cardiology

## 2012-03-08 IMAGING — CR DG CHEST 2V
2 series · 2 of 2 positions shown · non-contrast
Comparison: Chest x-ray 10/23/2010.

CLINICAL DATA: Bypass surgery.

CHEST - 2 VIEW

[w chest pa]
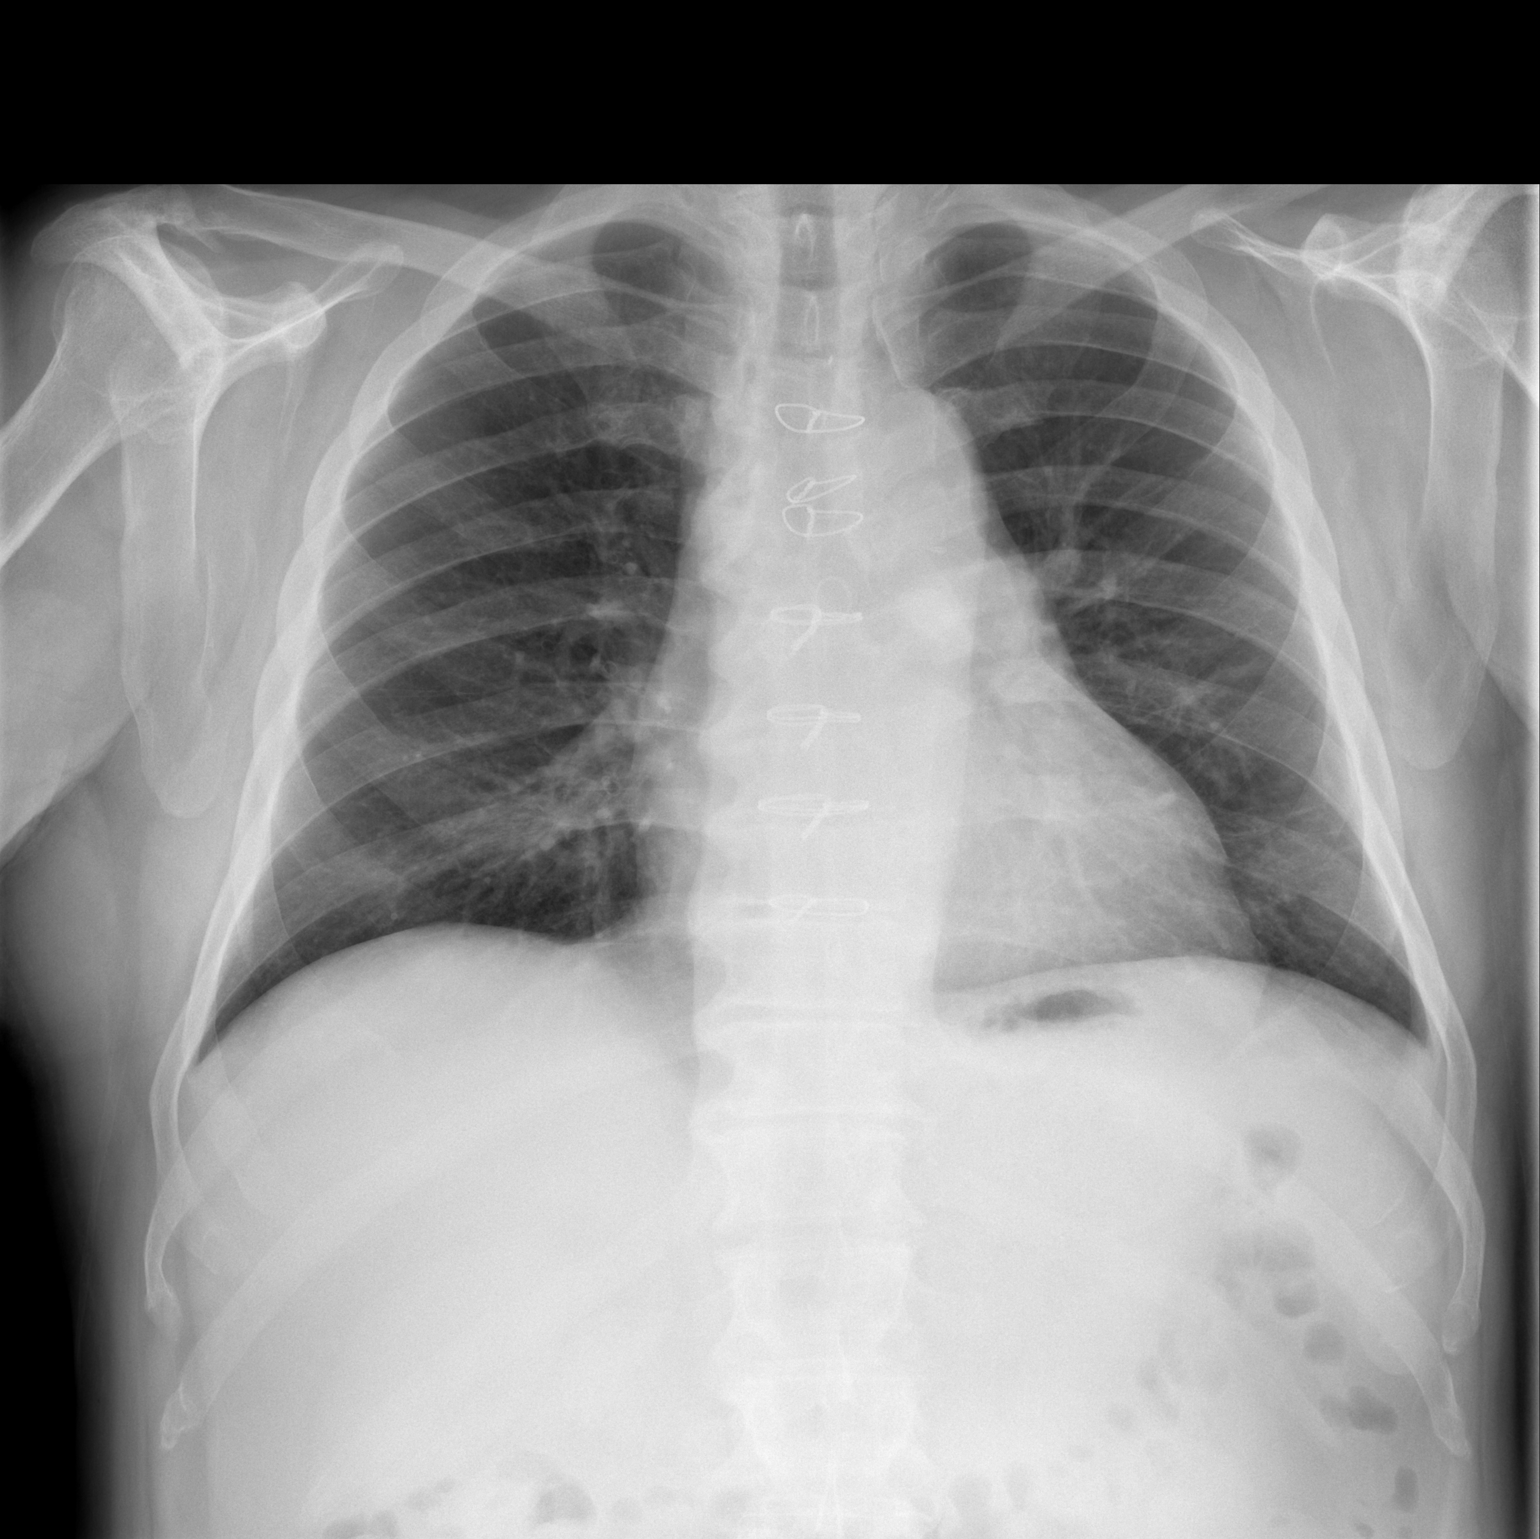

[w chest lat]
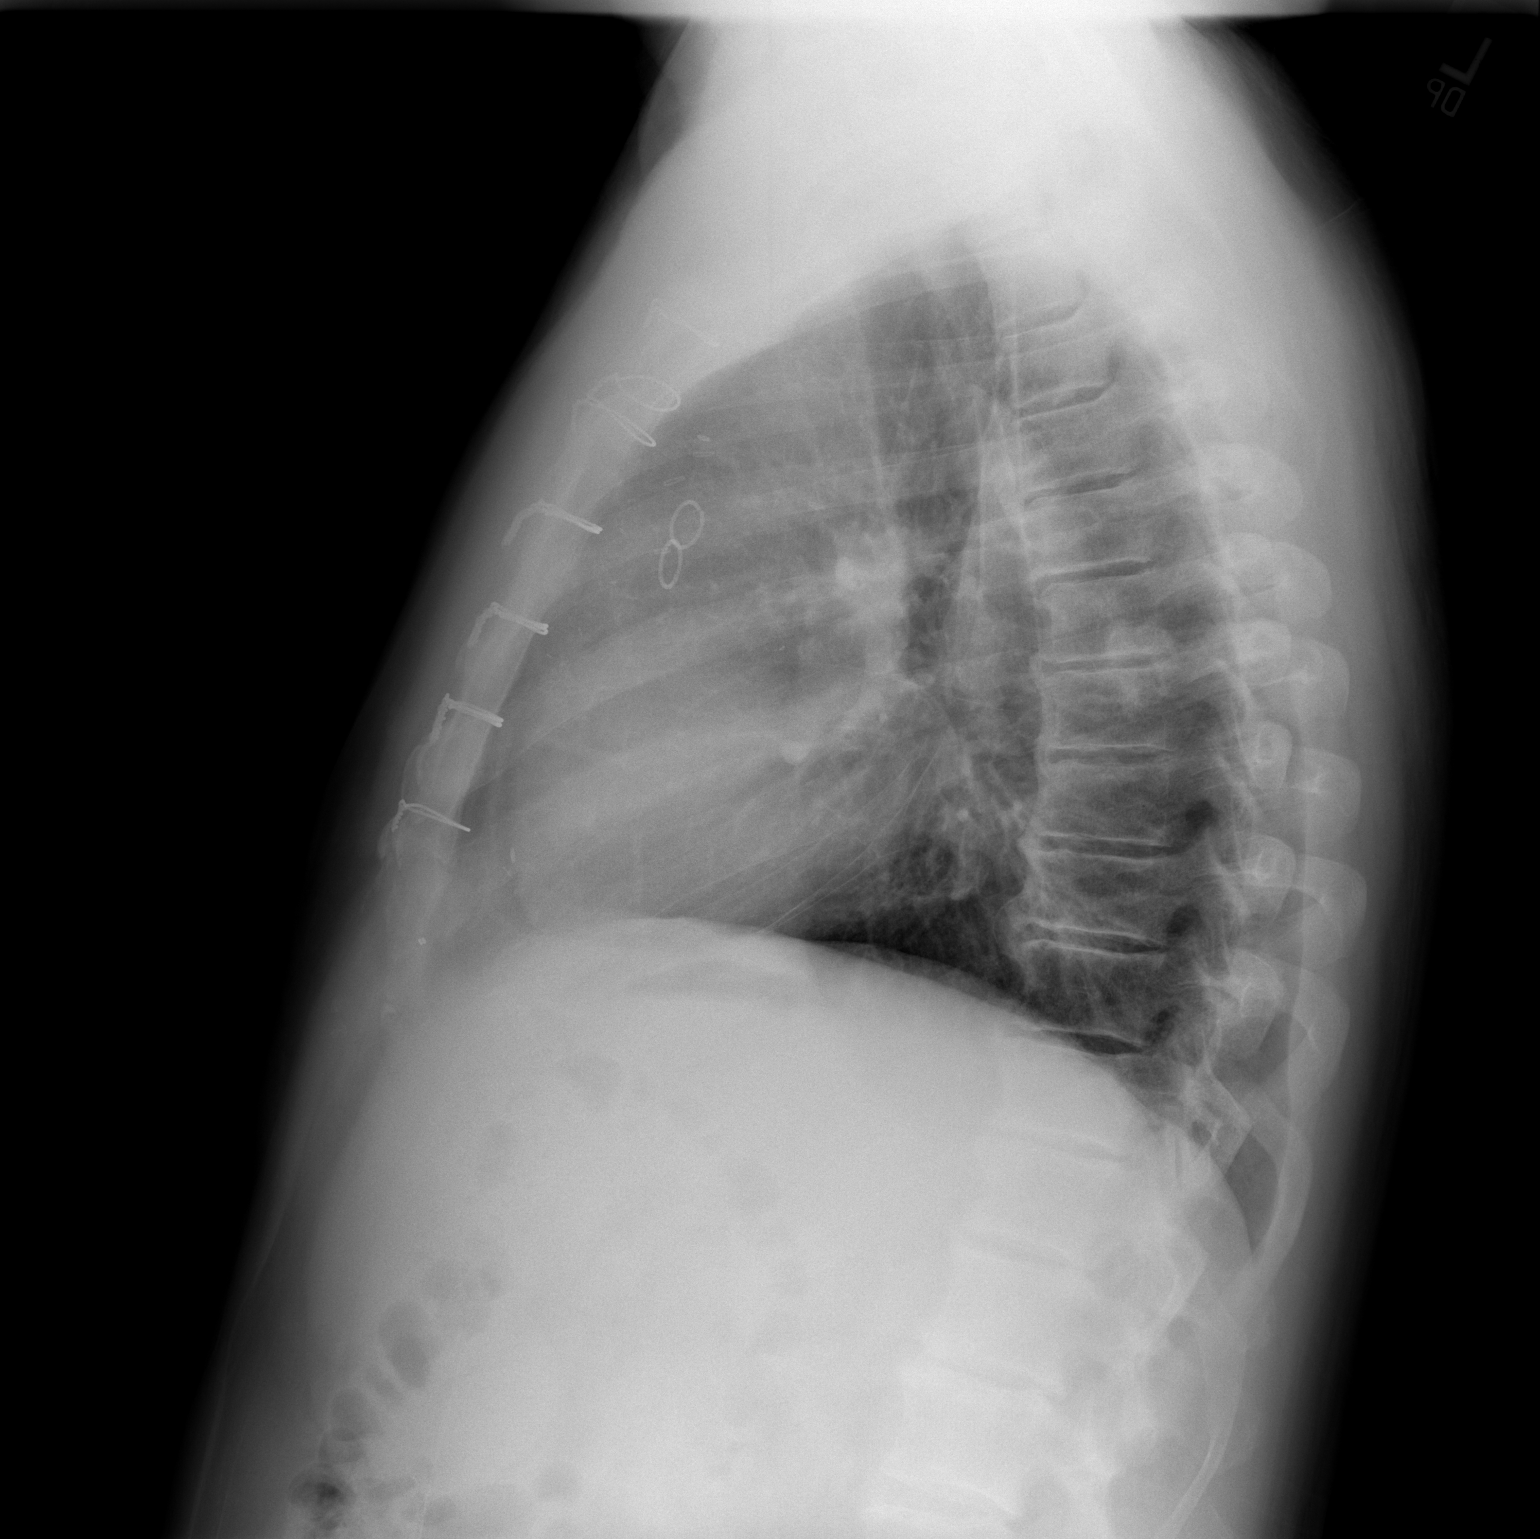

[2 of 2 positions shown; findings below may reference images not displayed]

FINDINGS: The cardiac silhouette, mediastinal and hilar contours
are normal.  Much improved basilar aeration.  No pleural effusion.
The bony thorax is intact.
IMPRESSION: No acute cardiopulmonary findings.

## 2012-04-04 ENCOUNTER — Ambulatory Visit (INDEPENDENT_AMBULATORY_CARE_PROVIDER_SITE_OTHER): Payer: BC Managed Care – PPO | Admitting: Cardiology

## 2012-04-04 ENCOUNTER — Encounter: Payer: Self-pay | Admitting: Cardiology

## 2012-04-04 VITALS — BP 156/88 | HR 72 | Ht 69.5 in | Wt 209.0 lb

## 2012-04-04 DIAGNOSIS — I10 Essential (primary) hypertension: Secondary | ICD-10-CM

## 2012-04-04 DIAGNOSIS — E669 Obesity, unspecified: Secondary | ICD-10-CM

## 2012-04-04 DIAGNOSIS — E785 Hyperlipidemia, unspecified: Secondary | ICD-10-CM

## 2012-04-04 DIAGNOSIS — I2510 Atherosclerotic heart disease of native coronary artery without angina pectoris: Secondary | ICD-10-CM

## 2012-04-04 NOTE — Progress Notes (Signed)
HPI The patient presents for follow up after CABG. Since I last saw him he has done well.  The patient denies any new symptoms such as chest discomfort, neck or arm discomfort. There has been no new shortness of breath, PND or orthopnea. There have been no reported palpitations, presyncope or syncope.  He isn't exercising but he has an active job.  He is due to have TKR on the left.    No Known Allergies  Current Outpatient Prescriptions  Medication Sig Dispense Refill  . aspirin 325 MG EC tablet Take 325 mg by mouth daily.        Marland Kitchen atorvastatin (LIPITOR) 40 MG tablet Take 40 mg by mouth daily.      Marland Kitchen lisinopril (PRINIVIL,ZESTRIL) 5 MG tablet Take 5 mg by mouth daily.      . metFORMIN (GLUCOPHAGE) 850 MG tablet Take 850 mg by mouth 2 (two) times daily with a meal.       . metoprolol (LOPRESSOR) 50 MG tablet Take 50 mg by mouth 2 (two) times daily.          Past Medical History  Diagnosis Date  . HTN (hypertension)   . Diabetes mellitus   . Anemia   . CAD (coronary artery disease)     with an ejection fraction of 65%  . History of kidney stones   . H/O: Bell's palsy     Past Surgical History  Procedure Date  . Cardiac catheterization 10/15/2010    Dr Antoine Poche  . Coronary artery bypass graft 10/20/2010    x 3, Dr Tyrone Sage    ROS:  As stated in the HPI and negative for all other systems.  PHYSICAL EXAM BP 156/88  Pulse 72  Ht 5' 9.5" (1.765 m)  Wt 209 lb (94.802 kg)  BMI 30.42 kg/m2 GENERAL:  Well appearing HEENT:  Pupils equal round and reactive, fundi not visualized, oral mucosa unremarkable, edentulous NECK:  No jugular venous distention, waveform within normal limits, carotid upstroke brisk and symmetric, no bruits, no thyromegaly LYMPHATICS:  No cervical, inguinal adenopathy LUNGS:  Clear to auscultation bilaterally BACK:  No CVA tenderness CHEST:  Well healed sternotomy scar HEART:  PMI not displaced or sustained,S1 and S2 within normal limits, no S3, no S4, no  clicks, no rubs, no murmurs ABD:  Flat, positive bowel sounds normal in frequency in pitch, no bruits, no rebound, no guarding, no midline pulsatile mass, no hepatomegaly, no splenomegaly EXT:  2 plus pulses throughout, no edema, no cyanosis no clubbing SKIN:  No rashes no nodules NEURO:  Cranial nerves II through XII grossly intact, motor grossly intact throughout [facility]:  Cognitively intact, oriented to person place and time  EKG:  Sinus rhythm, rate 72, poor anterior R wave progression, no acute ST-T wave changes 04/04/2012  ASSESSMENT AND PLAN  CAD (coronary artery disease) - The patient has no new sypmtoms. No further cardiovascular testing is indicated. We will continue with aggressive risk reduction and meds as listed.   HTN (hypertension) -His blood pressure is high.  It has not been previously. I have instructed him to get a blood pressure diary to keep track of this. Further med titration will be based on these results. We discussed this as well as therapeutic lifestyle changes. For now I will not change his medications. I think a few pounds of weight loss would be good to get him to be at a more optimal level.  Hyperlipidemia - He has this followed by Sissy Hoff,  MD. he is on target lipid doses.   Preoperative clearance  -  The patient has no new symptoms. He has a very active lifestyle. Therefore, based on ACC/AHA guidelines, the patient would be at acceptable risk for the planned procedure without further cardiovascular testing.

## 2012-04-04 NOTE — Patient Instructions (Addendum)
The current medical regimen is effective;  continue present plan and medications.  Please keep a blood pressure diary.   Follow up in 1 year with Dr Antoine Poche.  You will receive a letter in the mail 2 months before you are due.  Please call us when you receive this letter to schedule your follow up appointment.

## 2012-04-26 ENCOUNTER — Other Ambulatory Visit: Payer: Self-pay | Admitting: Orthopaedic Surgery

## 2012-05-01 ENCOUNTER — Encounter (HOSPITAL_COMMUNITY): Payer: Self-pay | Admitting: Pharmacy Technician

## 2012-05-01 NOTE — Pre-Procedure Instructions (Signed)
Mathew Clements  05/01/2012   Your procedure is scheduled on:  Tuesday, February 18th.  Report to [facility] at 10:30 AM.  Call this number if you have problems the morning of surgery: [phone]   Remember:   Do not eat food or drink liquids after midnight.   Take these medicines the morning of surgery with A SIP OF WATER: Metoprolol (Lopressor).    Do not wear jewelry, make-up or nail polish.  Do not wear lotions, powders, or perfumes. You may wear deodorant.  Do not shave 48 hours prior to surgery. Men may shave face and neck.  Do not bring valuables to the hospital.  Contacts, dentures or bridgework may not be worn into surgery.  Leave suitcase in the car. After surgery it may be brought to your room.  For patients admitted to the hospital, checkout time is 11:00 AM the day of  discharge.    Special Instructions: Shower using CHG 2 nights before surgery and the night before surgery.  If you shower the day of surgery use CHG.  Use special wash - you have one bottle of CHG for all showers.  You should use approximately 1/3 of the bottle for each shower.    Please read over the following fact sheets that you were given: Pain Booklet, Coughing and Deep Breathing, Blood Transfusion Information and Surgical Site Infection Prevention

## 2012-05-02 ENCOUNTER — Encounter (HOSPITAL_COMMUNITY)
Admission: RE | Admit: 2012-05-02 | Discharge: 2012-05-02 | Disposition: A | Discharge status: Home / Self Care | Payer: BC Managed Care – PPO | Source: Ambulatory Visit | Attending: Orthopaedic Surgery | Admitting: Orthopaedic Surgery

## 2012-05-02 ENCOUNTER — Encounter (HOSPITAL_COMMUNITY): Payer: Self-pay

## 2012-05-02 DIAGNOSIS — IMO0002 Reserved for concepts with insufficient information to code with codable children: Secondary | ICD-10-CM | POA: Insufficient documentation

## 2012-05-02 DIAGNOSIS — M1710 Unilateral primary osteoarthritis, unspecified knee: Secondary | ICD-10-CM | POA: Insufficient documentation

## 2012-05-02 DIAGNOSIS — I10 Essential (primary) hypertension: Secondary | ICD-10-CM | POA: Insufficient documentation

## 2012-05-02 DIAGNOSIS — Z01818 Encounter for other preprocedural examination: Secondary | ICD-10-CM | POA: Insufficient documentation

## 2012-05-02 DIAGNOSIS — E119 Type 2 diabetes mellitus without complications: Secondary | ICD-10-CM | POA: Insufficient documentation

## 2012-05-02 HISTORY — DX: Malignant (primary) neoplasm, unspecified: C80.1

## 2012-05-02 HISTORY — DX: Unspecified hemorrhoids: K64.9

## 2012-05-02 HISTORY — DX: Acute myocardial infarction, unspecified: I21.9

## 2012-05-02 HISTORY — DX: Shortness of breath: R06.02

## 2012-05-02 HISTORY — DX: Unspecified osteoarthritis, unspecified site: M19.90

## 2012-05-02 HISTORY — DX: Hyperlipidemia, unspecified: E78.5

## 2012-05-02 HISTORY — DX: Calculus of kidney: N20.0

## 2012-05-02 LAB — CBC WITH DIFFERENTIAL/PLATELET
Hemoglobin: 16.7 g/dL (ref 13.0–17.0)
Lymphocytes Relative: 13 % (ref 12–46)
Lymphs Abs: 1.7 10*3/uL (ref 0.7–4.0)
Monocytes Relative: 11 % (ref 3–12)
Neutro Abs: 9.9 10*3/uL — ABNORMAL HIGH (ref 1.7–7.7)
Neutrophils Relative %: 75 % (ref 43–77)
RBC: 5.37 MIL/uL (ref 4.22–5.81)

## 2012-05-02 LAB — URINALYSIS, ROUTINE W REFLEX MICROSCOPIC
Ketones, ur: NEGATIVE mg/dL
Leukocytes, UA: NEGATIVE
Nitrite: NEGATIVE
Protein, ur: NEGATIVE mg/dL

## 2012-05-02 LAB — BASIC METABOLIC PANEL
GFR calc Af Amer: >90 mL/min (ref >90)
GFR calc non Af Amer: >90 mL/min (ref >90)
Glucose, Bld: 146 mg/dL — ABNORMAL HIGH (ref 70–99)
Potassium: 3.9 mEq/L (ref 3.5–5.1)
Sodium: 141 mEq/L (ref 135–145)

## 2012-05-02 LAB — TYPE AND SCREEN: ABO/RH(D): O POS

## 2012-05-02 LAB — SURGICAL PCR SCREEN: MRSA, PCR: NEGATIVE

## 2012-05-02 LAB — PROTIME-INR: INR: 0.99 (ref 0.00–1.49)

## 2012-05-05 NOTE — H&P (Signed)
TOTAL KNEE ADMISSION H&P  Patient is being admitted for left total knee arthroplasty.  Subjective:  Chief Complaint:left knee pain.  HPI: Mathew Clements, 67 y.o. male, has a history of pain and functional disability in the left knee due to arthritis and has failed non-surgical conservative treatments for greater than 12 weeks to includeNSAID's and/or analgesics, corticosteriod injections, flexibility and strengthening excercises, weight reduction as appropriate and activity modification.  Onset of symptoms was gradual, starting 4 years ago with gradually worsening course since that time. The patient noted no past surgery on the left knee(s).  Patient currently rates pain in the left knee(s) at 8 out of 10 with activity. Patient has night pain, worsening of pain with activity and weight bearing, pain that interferes with activities of daily living and crepitus.  Patient has evidence of subchondral sclerosis, periarticular osteophytes and joint space narrowing by imaging studies. This patient has had no previous surgery. There is no active infection.  Patient Active Problem List   Diagnosis Date Noted  . Hyperlipidemia 11/20/2010  . Obesity 11/20/2010  . HTN (hypertension)   . Diabetes mellitus   . Anemia   . CAD (coronary artery disease)   . History of kidney stones   . H/O: Bell's palsy    Past Medical History  Diagnosis Date  . HTN (hypertension)   . Diabetes mellitus   . Anemia   . CAD (coronary artery disease)     with an ejection fraction of 65%  . History of kidney stones   . H/O: Bell's palsy   . Myocardial infarction   . Shortness of breath     with exertion  . Kidney stone   . Hyperlipemia   . Hemorrhoid   . Cancer     skin cancer right ear  . Arthritis     Past Surgical History  Procedure Laterality Date  . Cardiac catheterization  10/15/2010    Dr Antoine Poche  . Coronary artery bypass graft  10/20/2010    x 3, Dr Tyrone Sage  . Back surgery      No prescriptions  prior to admission   No Known Allergies  History  Substance Use Topics  . Smoking status: Never Smoker   . Smokeless tobacco: Never Used  . Alcohol Use: No    Family History  Problem Relation Age of Onset  . Diabetes Mother   . Stroke Brother   . Heart attack Sister      Review of Systems  Constitutional: Negative.   HENT: Negative.   Eyes: Negative.   Respiratory: Negative.   Cardiovascular: Negative.        History of coronary artery bypass  Gastrointestinal: Negative.   Genitourinary: Negative.   Musculoskeletal: Negative.   Skin: Negative.   Neurological: Negative.   Endo/Heme/Allergies: Negative.        Oral agent diabetic  Psychiatric/Behavioral: Negative.     Objective:  Physical Exam  Constitutional: He is oriented to person, place, and time. He appears well-nourished.  HENT:  Head: Atraumatic.  Eyes: EOM are normal.  Neck: Neck supple.  Cardiovascular: Normal rate and regular rhythm.   Respiratory: Breath sounds normal.  GI: Bowel sounds are normal.  Musculoskeletal:  Left knee exam: Range of motion 0-9 5.  Pain at the medial joint line with crepitation.  No significant effusion.  Good neurovascular status distally.  Hip and ankle motion full and pain-free both sides  Neurological: He is alert and oriented to person, place, and time.  Skin:  Skin is dry.  Psychiatric: He has a normal mood and affect.    Vital signs in last 24 hours:    Labs:   Estimated body mass index is 30.43 kg/(m^2) as calculated from the following:   Height as of 04/04/12: 5' 9.5" (1.765 m).   Weight as of 04/04/12: 94.802 kg (209 lb).   Imaging Review Plain radiographs demonstrate severe degenerative joint disease of the left knee(s). The overall alignment isneutral. The bone quality appears to be excellent for age and reported activity level.  Assessment/Plan:  End stage arthritis, left knee   The patient history, physical examination, clinical judgment of the provider  and imaging studies are consistent with end stage degenerative joint disease of the left knee(s) and total knee arthroplasty is deemed medically necessary. The treatment options including medical management, injection therapy arthroscopy and arthroplasty were discussed at length. The risks and benefits of total knee arthroplasty were presented and reviewed. The risks due to aseptic loosening, infection, stiffness, patella tracking problems, thromboembolic complications and other imponderables were discussed. The patient acknowledged the explanation, agreed to proceed with the plan and consent was signed. Patient is being admitted for inpatient treatment for surgery, pain control, PT, OT, prophylactic antibiotics, VTE prophylaxis, progressive ambulation and ADL's and discharge planning. The patient is planning to be discharged home with home health services

## 2012-05-08 MED ORDER — DEXTROSE 5 % IV SOLN
3.0000 g | INTRAVENOUS | Status: AC
  Administered 2012-05-09: 3 g via INTRAVENOUS
  Filled 2012-05-08: qty 3000

## 2012-05-08 MED ORDER — CHLORHEXIDINE GLUCONATE 4 % EX LIQD: 60.0000 mL | Freq: Once | CUTANEOUS | Status: DC

## 2012-05-08 MED ORDER — LACTATED RINGERS IV SOLN
INTRAVENOUS | Status: DC
  Administered 2012-05-09: 12:00:00 via INTRAVENOUS

## 2012-05-09 ENCOUNTER — Encounter (HOSPITAL_COMMUNITY): Payer: Self-pay | Admitting: *Deleted

## 2012-05-09 ENCOUNTER — Encounter (HOSPITAL_COMMUNITY): Payer: Self-pay | Admitting: Anesthesiology

## 2012-05-09 ENCOUNTER — Ambulatory Visit (HOSPITAL_COMMUNITY): Payer: BC Managed Care – PPO | Admitting: Anesthesiology

## 2012-05-09 ENCOUNTER — Encounter (HOSPITAL_COMMUNITY)
Admission: RE | Disposition: A | Discharge status: Home Health | Payer: Self-pay | Source: Ambulatory Visit | Attending: Orthopaedic Surgery

## 2012-05-09 ENCOUNTER — Inpatient Hospital Stay (HOSPITAL_COMMUNITY)
Admission: RE | Admit: 2012-05-09 | Discharge: 2012-05-11 | DRG: 209 | Disposition: A | Discharge status: Home Health | Payer: BC Managed Care – PPO | Source: Ambulatory Visit | Attending: Orthopaedic Surgery | Admitting: Orthopaedic Surgery

## 2012-05-09 DIAGNOSIS — Z87442 Personal history of urinary calculi: Secondary | ICD-10-CM

## 2012-05-09 DIAGNOSIS — Z01812 Encounter for preprocedural laboratory examination: Secondary | ICD-10-CM

## 2012-05-09 DIAGNOSIS — K649 Unspecified hemorrhoids: Secondary | ICD-10-CM | POA: Diagnosis present

## 2012-05-09 DIAGNOSIS — E119 Type 2 diabetes mellitus without complications: Secondary | ICD-10-CM | POA: Diagnosis present

## 2012-05-09 DIAGNOSIS — I2510 Atherosclerotic heart disease of native coronary artery without angina pectoris: Secondary | ICD-10-CM | POA: Diagnosis present

## 2012-05-09 DIAGNOSIS — Z85828 Personal history of other malignant neoplasm of skin: Secondary | ICD-10-CM

## 2012-05-09 DIAGNOSIS — I252 Old myocardial infarction: Secondary | ICD-10-CM

## 2012-05-09 DIAGNOSIS — E669 Obesity, unspecified: Secondary | ICD-10-CM | POA: Diagnosis present

## 2012-05-09 DIAGNOSIS — M1712 Unilateral primary osteoarthritis, left knee: Secondary | ICD-10-CM

## 2012-05-09 DIAGNOSIS — I10 Essential (primary) hypertension: Secondary | ICD-10-CM | POA: Diagnosis present

## 2012-05-09 DIAGNOSIS — M1710 Unilateral primary osteoarthritis, unspecified knee: Principal | ICD-10-CM | POA: Diagnosis present

## 2012-05-09 DIAGNOSIS — E785 Hyperlipidemia, unspecified: Secondary | ICD-10-CM | POA: Diagnosis present

## 2012-05-09 DIAGNOSIS — Z01818 Encounter for other preprocedural examination: Secondary | ICD-10-CM

## 2012-05-09 DIAGNOSIS — IMO0002 Reserved for concepts with insufficient information to code with codable children: Principal | ICD-10-CM | POA: Diagnosis present

## 2012-05-09 HISTORY — PX: TOTAL KNEE ARTHROPLASTY: SHX125

## 2012-05-09 LAB — GLUCOSE, CAPILLARY
Glucose-Capillary: 126 mg/dL — ABNORMAL HIGH (ref 70–99)
Glucose-Capillary: 138 mg/dL — ABNORMAL HIGH (ref 70–99)

## 2012-05-09 SURGERY — ARTHROPLASTY, KNEE, TOTAL
Anesthesia: Regional | Site: Knee | Laterality: Left | Wound class: Clean

## 2012-05-09 MED ORDER — BISACODYL 5 MG PO TBEC: 5.0000 mg | DELAYED_RELEASE_TABLET | Freq: Every day | ORAL | Status: DC | PRN

## 2012-05-09 MED ORDER — LACTATED RINGERS IV SOLN
INTRAVENOUS | Status: DC
  Administered 2012-05-10: 03:00:00 via INTRAVENOUS

## 2012-05-09 MED ORDER — INSULIN ASPART 100 UNIT/ML ~~LOC~~ SOLN
0.0000 [IU] | Freq: Three times a day (TID) | SUBCUTANEOUS | Status: DC
  Administered 2012-05-10 – 2012-05-11 (×4): 3 [IU] via SUBCUTANEOUS
  Administered 2012-05-11: 2 [IU] via SUBCUTANEOUS

## 2012-05-09 MED ORDER — PHENYLEPHRINE HCL 10 MG/ML IJ SOLN
INTRAMUSCULAR | Status: DC | PRN
  Administered 2012-05-09: 80 ug via INTRAVENOUS

## 2012-05-09 MED ORDER — ONDANSETRON HCL 4 MG/2ML IJ SOLN
4.0000 mg | Freq: Four times a day (QID) | INTRAMUSCULAR | Status: DC | PRN
  Administered 2012-05-11: 4 mg via INTRAVENOUS
  Filled 2012-05-09: qty 2

## 2012-05-09 MED ORDER — HYDROMORPHONE HCL PF 1 MG/ML IJ SOLN
0.2500 mg | INTRAMUSCULAR | Status: DC | PRN
  Administered 2012-05-09 (×4): 0.5 mg via INTRAVENOUS

## 2012-05-09 MED ORDER — OXYCODONE HCL 5 MG PO TABS
ORAL_TABLET | ORAL | Status: AC
  Filled 2012-05-09: qty 1

## 2012-05-09 MED ORDER — EPHEDRINE SULFATE 50 MG/ML IJ SOLN
INTRAMUSCULAR | Status: DC | PRN
  Administered 2012-05-09 (×2): 5 mg via INTRAVENOUS

## 2012-05-09 MED ORDER — ACETAMINOPHEN 325 MG PO TABS: 650.0000 mg | ORAL_TABLET | Freq: Four times a day (QID) | ORAL | Status: DC | PRN

## 2012-05-09 MED ORDER — CEFAZOLIN SODIUM-DEXTROSE 2-3 GM-% IV SOLR
2.0000 g | Freq: Four times a day (QID) | INTRAVENOUS | Status: AC
  Administered 2012-05-09 – 2012-05-10 (×2): 2 g via INTRAVENOUS
  Filled 2012-05-09 (×3): qty 50

## 2012-05-09 MED ORDER — FENTANYL CITRATE 0.05 MG/ML IJ SOLN
INTRAMUSCULAR | Status: DC | PRN
  Administered 2012-05-09: 100 ug via INTRAVENOUS
  Administered 2012-05-09 (×3): 50 ug via INTRAVENOUS

## 2012-05-09 MED ORDER — FENTANYL CITRATE 0.05 MG/ML IJ SOLN
INTRAMUSCULAR | Status: AC
  Administered 2012-05-09: 100 ug via INTRAVENOUS
  Filled 2012-05-09: qty 2

## 2012-05-09 MED ORDER — MENTHOL 3 MG MT LOZG: 1.0000 | LOZENGE | OROMUCOSAL | Status: DC | PRN

## 2012-05-09 MED ORDER — LIDOCAINE HCL (CARDIAC) 10 MG/ML IV SOLN
INTRAVENOUS | Status: DC | PRN
  Administered 2012-05-09: 100 mg via INTRAVENOUS

## 2012-05-09 MED ORDER — OXYCODONE HCL 5 MG/5ML PO SOLN: 5.0000 mg | Freq: Once | ORAL | Status: AC | PRN

## 2012-05-09 MED ORDER — HYDROMORPHONE HCL PF 1 MG/ML IJ SOLN
INTRAMUSCULAR | Status: AC
  Filled 2012-05-09: qty 1

## 2012-05-09 MED ORDER — METOCLOPRAMIDE HCL 10 MG PO TABS: 5.0000 mg | ORAL_TABLET | Freq: Three times a day (TID) | ORAL | Status: DC | PRN

## 2012-05-09 MED ORDER — ONDANSETRON HCL 4 MG PO TABS: 4.0000 mg | ORAL_TABLET | Freq: Four times a day (QID) | ORAL | Status: DC | PRN

## 2012-05-09 MED ORDER — LACTATED RINGERS IV SOLN
INTRAVENOUS | Status: DC | PRN
  Administered 2012-05-09 (×2): via INTRAVENOUS

## 2012-05-09 MED ORDER — ACETAMINOPHEN 10 MG/ML IV SOLN
INTRAVENOUS | Status: DC | PRN
  Administered 2012-05-09: 1000 mg via INTRAVENOUS

## 2012-05-09 MED ORDER — HYDROMORPHONE HCL PF 1 MG/ML IJ SOLN
0.5000 mg | INTRAMUSCULAR | Status: DC | PRN
  Administered 2012-05-09 – 2012-05-10 (×3): 1 mg via INTRAVENOUS
  Filled 2012-05-09 (×3): qty 1

## 2012-05-09 MED ORDER — OXYCODONE HCL 5 MG PO TABS
5.0000 mg | ORAL_TABLET | ORAL | Status: DC | PRN
  Administered 2012-05-09 – 2012-05-11 (×9): 10 mg via ORAL
  Filled 2012-05-09 (×9): qty 2

## 2012-05-09 MED ORDER — ACETAMINOPHEN 650 MG RE SUPP: 650.0000 mg | Freq: Four times a day (QID) | RECTAL | Status: DC | PRN

## 2012-05-09 MED ORDER — LISINOPRIL 10 MG PO TABS
10.0000 mg | ORAL_TABLET | Freq: Every day | ORAL | Status: DC
  Administered 2012-05-09 – 2012-05-11 (×3): 10 mg via ORAL
  Filled 2012-05-09 (×4): qty 1

## 2012-05-09 MED ORDER — METHOCARBAMOL 500 MG PO TABS
500.0000 mg | ORAL_TABLET | Freq: Four times a day (QID) | ORAL | Status: DC | PRN
  Administered 2012-05-09 – 2012-05-10 (×3): 500 mg via ORAL
  Filled 2012-05-09 (×3): qty 1

## 2012-05-09 MED ORDER — ATORVASTATIN CALCIUM 40 MG PO TABS
40.0000 mg | ORAL_TABLET | Freq: Every day | ORAL | Status: DC
  Administered 2012-05-09 – 2012-05-10 (×2): 40 mg via ORAL
  Filled 2012-05-09 (×4): qty 1

## 2012-05-09 MED ORDER — OXYCODONE HCL 5 MG PO TABS
5.0000 mg | ORAL_TABLET | Freq: Once | ORAL | Status: AC | PRN
  Administered 2012-05-09: 5 mg via ORAL

## 2012-05-09 MED ORDER — FENTANYL CITRATE 0.05 MG/ML IJ SOLN: 50.0000 ug | INTRAMUSCULAR | Status: DC | PRN

## 2012-05-09 MED ORDER — DOCUSATE SODIUM 100 MG PO CAPS
100.0000 mg | ORAL_CAPSULE | Freq: Two times a day (BID) | ORAL | Status: DC
  Administered 2012-05-09 – 2012-05-11 (×4): 100 mg via ORAL
  Filled 2012-05-09 (×5): qty 1

## 2012-05-09 MED ORDER — ASPIRIN EC 325 MG PO TBEC
325.0000 mg | DELAYED_RELEASE_TABLET | Freq: Two times a day (BID) | ORAL | Status: DC
  Administered 2012-05-10 – 2012-05-11 (×3): 325 mg via ORAL
  Filled 2012-05-09 (×5): qty 1

## 2012-05-09 MED ORDER — SODIUM CHLORIDE 0.9 % IR SOLN
Status: DC | PRN
  Administered 2012-05-09: 3000 mL

## 2012-05-09 MED ORDER — METFORMIN HCL 850 MG PO TABS
850.0000 mg | ORAL_TABLET | Freq: Two times a day (BID) | ORAL | Status: DC
  Administered 2012-05-10 – 2012-05-11 (×3): 850 mg via ORAL
  Filled 2012-05-09 (×5): qty 1

## 2012-05-09 MED ORDER — METHOCARBAMOL 100 MG/ML IJ SOLN
500.0000 mg | Freq: Four times a day (QID) | INTRAMUSCULAR | Status: DC | PRN
  Filled 2012-05-09: qty 5

## 2012-05-09 MED ORDER — METOCLOPRAMIDE HCL 5 MG/ML IJ SOLN: 5.0000 mg | Freq: Three times a day (TID) | INTRAMUSCULAR | Status: DC | PRN

## 2012-05-09 MED ORDER — MIDAZOLAM HCL 2 MG/2ML IJ SOLN
INTRAMUSCULAR | Status: AC
  Administered 2012-05-09: 1 mg via INTRAVENOUS
  Filled 2012-05-09: qty 2

## 2012-05-09 MED ORDER — PROPOFOL 10 MG/ML IV BOLUS
INTRAVENOUS | Status: DC | PRN
  Administered 2012-05-09: 150 mg via INTRAVENOUS

## 2012-05-09 MED ORDER — ALUM & MAG HYDROXIDE-SIMETH 200-200-20 MG/5ML PO SUSP: 30.0000 mL | ORAL | Status: DC | PRN

## 2012-05-09 MED ORDER — ONDANSETRON HCL 4 MG/2ML IJ SOLN
INTRAMUSCULAR | Status: DC | PRN
  Administered 2012-05-09: 4 mg via INTRAVENOUS

## 2012-05-09 MED ORDER — BUPIVACAINE-EPINEPHRINE PF 0.5-1:200000 % IJ SOLN
INTRAMUSCULAR | Status: DC | PRN
  Administered 2012-05-09: 30 mL

## 2012-05-09 MED ORDER — MIDAZOLAM HCL 2 MG/2ML IJ SOLN: 0.5000 mg | INTRAMUSCULAR | Status: DC | PRN

## 2012-05-09 MED ORDER — METOPROLOL TARTRATE 50 MG PO TABS
50.0000 mg | ORAL_TABLET | Freq: Two times a day (BID) | ORAL | Status: DC
  Administered 2012-05-09 – 2012-05-11 (×4): 50 mg via ORAL
  Filled 2012-05-09 (×7): qty 1

## 2012-05-09 MED ORDER — PHENOL 1.4 % MT LIQD: 1.0000 | OROMUCOSAL | Status: DC | PRN

## 2012-05-09 MED ORDER — DIPHENHYDRAMINE HCL 12.5 MG/5ML PO ELIX: 12.5000 mg | ORAL_SOLUTION | ORAL | Status: DC | PRN

## 2012-05-09 SURGICAL SUPPLY — 70 items
BANDAGE ELASTIC 4 VELCRO ST LF (GAUZE/BANDAGES/DRESSINGS) ×2 IMPLANT
BANDAGE ELASTIC 6 VELCRO ST LF (GAUZE/BANDAGES/DRESSINGS) ×1 IMPLANT
BANDAGE ESMARK 6X9 LF (GAUZE/BANDAGES/DRESSINGS) ×1 IMPLANT
BANDAGE GAUZE ELAST BULKY 4 IN (GAUZE/BANDAGES/DRESSINGS) ×4 IMPLANT
BLADE SAGITTAL 25.0X1.19X90 (BLADE) ×2 IMPLANT
BLADE SURG ROTATE 9660 (MISCELLANEOUS) IMPLANT
BNDG CMPR 9X6 STRL LF SNTH (GAUZE/BANDAGES/DRESSINGS) ×1
BNDG CMPR MED 10X6 ELC LF (GAUZE/BANDAGES/DRESSINGS) ×1
BNDG ELASTIC 6X10 VLCR STRL LF (GAUZE/BANDAGES/DRESSINGS) ×2 IMPLANT
BNDG ESMARK 6X9 LF (GAUZE/BANDAGES/DRESSINGS) ×2
BOWL SMART MIX CTS (DISPOSABLE) ×2 IMPLANT
CEMENT HV SMART SET (Cement) ×4 IMPLANT
CLOTH BEACON ORANGE TIMEOUT ST (SAFETY) ×2 IMPLANT
COVER SURGICAL LIGHT HANDLE (MISCELLANEOUS) ×2 IMPLANT
CUFF TOURNIQUET SINGLE 34IN LL (TOURNIQUET CUFF) ×2 IMPLANT
CUFF TOURNIQUET SINGLE 44IN (TOURNIQUET CUFF) IMPLANT
DRAPE EXTREMITY T 121X128X90 (DRAPE) ×2 IMPLANT
DRAPE PROXIMA HALF (DRAPES) ×2 IMPLANT
DRAPE U-SHAPE 47X51 STRL (DRAPES) ×2 IMPLANT
DRSG ADAPTIC 3X8 NADH LF (GAUZE/BANDAGES/DRESSINGS) ×2 IMPLANT
DRSG PAD ABDOMINAL 8X10 ST (GAUZE/BANDAGES/DRESSINGS) ×2 IMPLANT
DURAPREP 26ML APPLICATOR (WOUND CARE) ×2 IMPLANT
ELECT REM PT RETURN 9FT ADLT (ELECTROSURGICAL) ×2
ELECTRODE REM PT RTRN 9FT ADLT (ELECTROSURGICAL) ×1 IMPLANT
FACESHIELD LNG OPTICON STERILE (SAFETY) ×4 IMPLANT
GLOVE BIO SURGEON STRL SZ7.5 (GLOVE) ×1 IMPLANT
GLOVE BIO SURGEON STRL SZ8.5 (GLOVE) ×4 IMPLANT
GLOVE BIOGEL PI IND STRL 6.5 (GLOVE) IMPLANT
GLOVE BIOGEL PI IND STRL 7.0 (GLOVE) IMPLANT
GLOVE BIOGEL PI IND STRL 8 (GLOVE) ×1 IMPLANT
GLOVE BIOGEL PI IND STRL 8.5 (GLOVE) ×1 IMPLANT
GLOVE BIOGEL PI INDICATOR 6.5 (GLOVE) ×2
GLOVE BIOGEL PI INDICATOR 7.0 (GLOVE) ×2
GLOVE BIOGEL PI INDICATOR 8 (GLOVE) ×1
GLOVE BIOGEL PI INDICATOR 8.5 (GLOVE) ×2
GLOVE SS BIOGEL STRL SZ 8 (GLOVE) ×1 IMPLANT
GLOVE SUPERSENSE BIOGEL SZ 8 (GLOVE) ×1
GOWN PREVENTION PLUS XLARGE (GOWN DISPOSABLE) ×2 IMPLANT
GOWN PREVENTION PLUS XXLARGE (GOWN DISPOSABLE) ×4 IMPLANT
GOWN STRL NON-REIN LRG LVL3 (GOWN DISPOSABLE) ×2 IMPLANT
HANDPIECE INTERPULSE COAX TIP (DISPOSABLE) ×2
HOOD PEEL AWAY FACE SHEILD DIS (HOOD) ×2 IMPLANT
IMMOBILIZER KNEE 20 (SOFTGOODS)
IMMOBILIZER KNEE 20 THIGH 36 (SOFTGOODS) IMPLANT
IMMOBILIZER KNEE 22 UNIV (SOFTGOODS) ×2 IMPLANT
IMMOBILIZER KNEE 24 THIGH 36 (MISCELLANEOUS) IMPLANT
IMMOBILIZER KNEE 24 UNIV (MISCELLANEOUS)
KIT BASIN OR (CUSTOM PROCEDURE TRAY) ×2 IMPLANT
KIT ROOM TURNOVER OR (KITS) ×2 IMPLANT
MANIFOLD NEPTUNE II (INSTRUMENTS) ×2 IMPLANT
NS IRRIG 1000ML POUR BTL (IV SOLUTION) ×2 IMPLANT
PACK TOTAL JOINT (CUSTOM PROCEDURE TRAY) ×2 IMPLANT
PAD ARMBOARD 7.5X6 YLW CONV (MISCELLANEOUS) ×4 IMPLANT
PAD CAST 4YDX4 CTTN HI CHSV (CAST SUPPLIES) IMPLANT
PADDING CAST ABS 6INX4YD NS (CAST SUPPLIES) ×1
PADDING CAST ABS COTTON 6X4 NS (CAST SUPPLIES) IMPLANT
PADDING CAST COTTON 4X4 STRL (CAST SUPPLIES) ×2
SET HNDPC FAN SPRY TIP SCT (DISPOSABLE) ×1 IMPLANT
SPONGE GAUZE 4X4 12PLY (GAUZE/BANDAGES/DRESSINGS) ×2 IMPLANT
STAPLER VISISTAT 35W (STAPLE) ×2 IMPLANT
SUCTION FRAZIER TIP 10 FR DISP (SUCTIONS) IMPLANT
SUT VIC AB 0 CT1 27 (SUTURE) ×4
SUT VIC AB 0 CT1 27XBRD ANBCTR (SUTURE) ×2 IMPLANT
SUT VIC AB 2-0 CT1 27 (SUTURE) ×4
SUT VIC AB 2-0 CT1 TAPERPNT 27 (SUTURE) ×2 IMPLANT
SUT VLOC 180 0 24IN GS25 (SUTURE) ×2 IMPLANT
TOWEL OR 17X24 6PK STRL BLUE (TOWEL DISPOSABLE) ×2 IMPLANT
TOWEL OR 17X26 10 PK STRL BLUE (TOWEL DISPOSABLE) ×2 IMPLANT
TRAY FOLEY CATH 14FR (SET/KITS/TRAYS/PACK) ×2 IMPLANT
WATER STERILE IRR 1000ML POUR (IV SOLUTION) ×4 IMPLANT

## 2012-05-09 NOTE — Anesthesia Preprocedure Evaluation (Addendum)
Anesthesia Evaluation  Patient identified by MRN, date of birth, ID band Patient awake    Reviewed: Allergy & Precautions, H&P , NPO status , Patient's Chart, lab work & pertinent test results, reviewed documented beta blocker date and time   Airway Mallampati: II TM Distance: >3 FB Neck ROM: Full    Dental no notable dental hx. (+) Edentulous Upper, Edentulous Lower and Dental Advisory Given   Pulmonary shortness of breath and with exertion,  breath sounds clear to auscultation  Pulmonary exam normal       Cardiovascular hypertension, On Medications and On Home Beta Blockers + CAD, + Past MI and + CABG Rhythm:Regular Rate:Normal  EF 65%    Neuro/Psych negative neurological ROS  negative psych ROS   GI/Hepatic negative GI ROS, Neg liver ROS,   Endo/Other  diabetes, Type 2, Oral Hypoglycemic Agents  Renal/GU Renal diseasenegative Renal ROS  negative genitourinary   Musculoskeletal   Abdominal   Peds  Hematology negative hematology ROS (+)   Anesthesia Other Findings   Reproductive/Obstetrics negative OB ROS                          Anesthesia Physical Anesthesia Plan  ASA: III  Anesthesia Plan: General and Regional   Post-op Pain Management:    Induction: Intravenous  Airway Management Planned: LMA  Additional Equipment:   Intra-op Plan:   Post-operative Plan: Extubation in OR  Informed Consent: I have reviewed the patients History and Physical, chart, labs and discussed the procedure including the risks, benefits and alternatives for the proposed anesthesia with the patient or authorized representative who has indicated his/her understanding and acceptance.   Dental advisory given  Plan Discussed with: CRNA  Anesthesia Plan Comments:         Anesthesia Quick Evaluation

## 2012-05-09 NOTE — Anesthesia Procedure Notes (Signed)
Anesthesia Regional Block:  Femoral nerve block  Pre-Anesthetic Checklist: ,, timeout performed, Correct Patient, Correct Site, Correct Laterality, Correct Procedure, Correct Position, site marked, Risks and benefits discussed, pre-op evaluation,  At surgeon's request and post-op pain management  Laterality: Left  Prep: Maximum Sterile Barrier Precautions used and chloraprep       Needles:  Injection technique: Single-shot  Needle Type: Echogenic Stimulator Needle      Needle Gauge: 22 and 22 G    Additional Needles:  Procedures: ultrasound guided (picture in chart) and nerve stimulator Femoral nerve block  Nerve Stimulator or Paresthesia:  Response: Patellar respose, 0.4 mA,   Additional Responses:   Narrative:  Start time: 05/09/2012 12:40 PM End time: 05/09/2012 12:48 PM Injection made incrementally with aspirations every 5 mL. Anesthesiologist: Ramon Zanders,MD  Additional Notes: 2% Lidocaine skin wheel.   Femoral nerve block

## 2012-05-09 NOTE — Anesthesia Postprocedure Evaluation (Signed)
Anesthesia Post Note  Patient: Mathew Clements  Procedure(s) Performed: Procedure(s) (LRB): TOTAL KNEE ARTHROPLASTY (Left)  Anesthesia type: general  Patient location: PACU  Post pain: Pain level controlled  Post assessment: Patient's Cardiovascular Status Stable  Last Vitals:  Filed Vitals:   05/09/12 1549  BP: 121/79  Pulse:   Temp: 36.3 C  Resp:     Post vital signs: Reviewed and stable  Level of consciousness: sedated  Complications: No apparent anesthesia complications

## 2012-05-09 NOTE — Interval H&P Note (Signed)
History and Physical Interval Note:  05/09/2012 12:44 PM  Mathew Clements  has presented today for surgery, with the diagnosis of LEFT KNEE DEGENERATIVE JOINT DISEASE  The various methods of treatment have been discussed with the patient and family. After consideration of risks, benefits and other options for treatment, the patient has consented to  Procedure(s): TOTAL KNEE ARTHROPLASTY (Left) as a surgical intervention .  The patient's history has been reviewed, patient examined, no change in status, stable for surgery.  I have reviewed the patient's chart and labs.  Questions were answered to the patient's satisfaction.     Denali Sharma G

## 2012-05-09 NOTE — Progress Notes (Signed)
[facility]  Patient Status: New  [facility] is providing the following services: PT - referral from MD office. Thank you  If patient discharges after hours, please call [phone].   Jodene Nam 05/09/2012, 5:20 PM

## 2012-05-09 NOTE — Plan of Care (Signed)
Problem: Consults Goal: Diagnosis- Total Joint Replacement Primary Total Knee     

## 2012-05-09 NOTE — Progress Notes (Signed)
Orthopedic Tech Progress Note Patient Details:  Mathew Clements 25-Sep-1945 [id number]  CPM Left Knee CPM Left Knee: On Left Knee Flexion (Degrees): 60 Left Knee Extension (Degrees): 0 Additional Comments: TRAPEZE BAR   Shawnie Pons 05/09/2012, 4:43 PM

## 2012-05-09 NOTE — Transfer of Care (Signed)
Immediate Anesthesia Transfer of Care Note  Patient: Mathew Clements  Procedure(s) Performed: Procedure(s): TOTAL KNEE ARTHROPLASTY (Left)  Patient Location: PACU  Anesthesia Type:General and Regional  Level of Consciousness: awake, alert , oriented and patient cooperative  Airway & Oxygen Therapy: Patient Spontanous Breathing and Patient connected to nasal cannula oxygen  Post-op Assessment: Report given to PACU RN, Post -op Vital signs reviewed and stable and Patient moving all extremities  Post vital signs: Reviewed and stable  Complications: No apparent anesthesia complications

## 2012-05-09 NOTE — Preoperative (Signed)
Beta Blockers   Reason not to administer Beta Blockers:taken 05/09/12 @ 0800

## 2012-05-09 NOTE — Op Note (Signed)
PREOP DIAGNOSIS: DJD LEFT KNEE POSTOP DIAGNOSIS: DJD LEFT KNEE PROCEDURE: LEFT TKR ANESTHESIA: General and block ATTENDING SURGEON: Demoni Parmar G ASSISTANT: Lindwood Qua PA  INDICATIONS FOR PROCEDURE: Mathew Clements is a 67 y.o. male who has struggled for a long time with pain due to degenerative arthritis of the left knee.  The patient has failed many conservative non-operative measures and at this point has pain which limits the ability to sleep and walk.  The patient is offered total knee replacement.  Informed operative consent was obtained after discussion of possible risks of anesthesia, infection, neurovascular injury, DVT, and death.  The importance of the post-operative rehabilitation protocol to optimize result was stressed extensively with the patient.  SUMMARY OF FINDINGS AND PROCEDURE:  Mathew Clements was taken to the operative suite where under the above anesthesia a left knee replacement was performed.  There were advanced degenerative changes and the bone quality was excellent.  We used the DePuy system and placed size standard plus femur, 5 tibia, 38 mm all polyethylene patella, and a size 10 mm spacer.  The patient was admitted for appropriate post-op care to include perioperative antibiotics and mechanical and pharmacologic measures for DVT prophylaxis.  DESCRIPTION OF PROCEDURE:  Mathew Clements was taken to the operative suite where the above anesthesia was applied.  The patient was positioned supine and prepped and draped in normal sterile fashion.  An appropriate time out was performed.  After the administration of kefzol pre-op antibiotic the leg was elevated and exsanguinated and a tourniquet inflated.  A standard longitudinal incision was made on the anterior knee.  Dissection was carried down to the extensor mechanism.  All appropriate anti-infective measures were used including the pre-operative antibiotic, betadine impregnated drape, and closed hooded exhaust systems for  each member of the surgical team.  A medial parapatellar incision was made in the extensor mechanism and the knee cap flipped and the knee flexed.  Some residual meniscal tissues were removed along with any remaining ACL/PCL tissue.  A guide was placed on the tibia and a flat cut was made on it's superior surface.  An intramedullary guide was placed in the femur and was utilized to make anterior and posterior cuts creating an appropriate flexion gap.  A second intramedullary guide was placed in the femur to make a distal cut properly balancing the knee with an extension gap equal to the flexion gap.  The three bones sized to the above mentioned sizes and the appropriate guides were placed and utilized.  A trial reduction was done and the knee easily came to full extension and the patella tracked well on flexion.  The trial components were removed and all bones were cleaned with pulsatile lavage and then dried thoroughly.  Cement was mixed and was pressurized onto the bones followed by placement of the aforementioned components.  Excess cement was trimmed and pressure was held on the components until the cement had hardened.  The tourniquet was deflated and a small amount of bleeding was controlled with cautery and pressure.  The knee was irrigated thoroughly.  The extensor mechanism was re-approximated with V-loc suture in running fashion.  The knee was flexed and the repair was solid.  The subcutaneous tissues were re-approximated with #0 and #2-0 vicryl and the skin closed with a subcuticular stitch and steristrips.  A sterile dressing was applied.  Intraoperative fluids, EBL, and tourniquet time can be obtained from anesthesia records.  DISPOSITION:  The patient was taken to recovery room  in stable condition and admitted for appropriate post-op care to include peri-operative antibiotic and DVT prophylaxis with mechanical and pharmacologic measures.  Antron Seth G 05/09/2012, 3:25 PM

## 2012-05-10 LAB — CBC
Hemoglobin: 12.7 g/dL — ABNORMAL LOW (ref 13.0–17.0)
MCHC: 34.6 g/dL (ref 30.0–36.0)
RDW: 12.9 % (ref 11.5–15.5)
WBC: 12.2 10*3/uL — ABNORMAL HIGH (ref 4.0–10.5)

## 2012-05-10 LAB — BASIC METABOLIC PANEL
GFR calc Af Amer: >90 mL/min (ref >90)
GFR calc non Af Amer: >90 mL/min (ref >90)
Potassium: 4.2 mEq/L (ref 3.5–5.1)
Sodium: 137 mEq/L (ref 135–145)

## 2012-05-10 LAB — GLUCOSE, CAPILLARY
Glucose-Capillary: 189 mg/dL — ABNORMAL HIGH (ref 70–99)
Glucose-Capillary: 155 mg/dL — ABNORMAL HIGH (ref 70–99)
Glucose-Capillary: 187 mg/dL — ABNORMAL HIGH (ref 70–99)
Glucose-Capillary: 127 mg/dL — ABNORMAL HIGH (ref 70–99)

## 2012-05-10 NOTE — Evaluation (Signed)
Physical Therapy Evaluation Patient Details Name: Mathew Clements MRN: [id number] DOB: 27-Apr-1945 Today's Date: 05/10/2012 Time: 4098-1191 PT Time Calculation (min): 35 min  PT Assessment / Plan / Recommendation Clinical Impression  pt presents with L TKA.  pt with weakness and some buckling in L LE during stance.  pt very motivated to improve mobility and needs to D/C by Friday to attend his brother's [facility].  Anticipate good progress.      PT Assessment  Patient needs continued PT services    Follow Up Recommendations  Home health PT;Supervision/Assistance - 24 hour    Does the patient have the potential to tolerate intense rehabilitation      Barriers to Discharge None      Equipment Recommendations  Rolling walker with 5" wheels (3-in-1)    Recommendations for Other Services     Frequency 7X/week    Precautions / Restrictions Precautions Precautions: Knee;Fall Precaution Booklet Issued: Yes (comment) Required Braces or Orthoses: Knee Immobilizer - Left Knee Immobilizer - Left: On when out of bed or walking Restrictions Weight Bearing Restrictions: Yes LLE Weight Bearing: Weight bearing as tolerated   Pertinent Vitals/Pain 4-5/10.  Requested pain meds.        Mobility  Bed Mobility Bed Mobility: Supine to Sit;Sitting - Scoot to Edge of Bed Supine to Sit: 4: Min assist;With rails Sitting - Scoot to [place] of Bed: 4: Min assist Details for Bed Mobility Assistance: cues for sequencing Transfers Transfers: Sit to Stand;Stand to Sit Sit to Stand: 4: Min assist;With upper extremity assist;From bed Stand to Sit: 4: Min assist;With upper extremity assist;To chair/3-in-1;With armrests Details for Transfer Assistance: cues for UE use, positioning of LEs, controlling descent to chair.   Ambulation/Gait Ambulation/Gait Assistance: 4: Min assist Ambulation Distance (Feet): 15 Feet Assistive device: Rolling walker Ambulation/Gait Assistance Details: cues for gait  sequencing, use of RW,upright posture.   Gait Pattern: Step-to pattern;Decreased step length - right;Decreased stance time - left Stairs: No Wheelchair Mobility Wheelchair Mobility: No    Exercises Total Joint Exercises Ankle Circles/Pumps: AROM;Both;10 reps Long Arc Quad: AAROM;Left;10 reps Knee Flexion: AAROM;Left;10 reps   PT Diagnosis: Abnormality of gait;Acute pain  PT Problem List: Decreased strength;Decreased range of motion;Decreased activity tolerance;Decreased balance;Decreased mobility;Decreased knowledge of use of DME;Pain PT Treatment Interventions: DME instruction;Gait training;Stair training;Functional mobility training;Therapeutic activities;Therapeutic exercise;Balance training;Patient/family education   PT Goals Acute Rehab PT Goals PT Goal Formulation: With patient Time For Goal Achievement: 05/17/12 Potential to Achieve Goals: Good Pt will go Supine/Side to Sit: with modified independence PT Goal: Supine/Side to Sit - Progress: Goal set today Pt will go Sit to Supine/Side: with modified independence PT Goal: Sit to Supine/Side - Progress: Goal set today Pt will go Sit to Stand: with modified independence PT Goal: Sit to Stand - Progress: Goal set today Pt will go Stand to Sit: with modified independence PT Goal: Stand to Sit - Progress: Goal set today Pt will Ambulate: >150 feet;with modified independence;with rolling walker PT Goal: Ambulate - Progress: Goal set today Pt will Go Up / Down Stairs: 3-5 stairs;with min assist;with rolling walker PT Goal: Up/Down Stairs - Progress: Goal set today Pt will Perform Home Exercise Program: with supervision, verbal cues required/provided PT Goal: Perform Home Exercise Program - Progress: Goal set today  Visit Information  Last PT Received On: 05/10/12 Assistance Needed: +2 (helpful for chair follow.  )    Subjective Data  Subjective: He needs to be able to go to a [employer] Friday.  Patient Stated Goal: Go  to brother's [facility] Friday.     Prior Functioning  Home Living Lives With: Spouse Available Help at Discharge: Family;Available 24 hours/day Type of Home: House Home Access: Stairs to enter [employer] of Steps: 5 Entrance Stairs-Rails: Left Home Layout: One level Home Adaptive Equipment: None Prior Function Level of Independence: Independent Able to Take Stairs?: Yes Driving: Yes Vocation: Full time employment Communication Communication: No difficulties    Cognition  Cognition Overall Cognitive Status: Appears within functional limits for tasks assessed/performed Arousal/Alertness: Awake/alert Orientation Level: Appears intact for tasks assessed Behavior During Session: [facility] for tasks performed    Extremity/Trunk Assessment Right Lower Extremity Assessment RLE ROM/Strength/Tone: WFL for tasks assessed RLE Sensation: WFL - Light Touch Left Lower Extremity Assessment LLE ROM/Strength/Tone: Deficits LLE ROM/Strength/Tone Deficits: AAROM ~ 15 - 60.   LLE Sensation: WFL - Light Touch Trunk Assessment Trunk Assessment: Normal   Balance Balance Balance Assessed: No  End of Session PT - End of Session Equipment Utilized During Treatment: Gait belt;Left knee immobilizer Activity Tolerance: Patient tolerated treatment well Patient left: in chair;with call bell/phone within reach;with family/visitor present Nurse Communication: Mobility status CPM Left Knee CPM Left Knee: Off  GP     Sunny Schlein, B and E [phone] 05/10/2012, 11:53 AM

## 2012-05-10 NOTE — Progress Notes (Signed)
Physical Therapy Note   05/10/12 1500  PT Visit Information  Last PT Received On 05/10/12  Assistance Needed +1  PT Time Calculation  PT Start Time 1352  PT Stop Time 1423  PT Time Calculation (min) 31 min  Subjective Data  Subjective I'm doing good!  Precautions  Precautions Knee;Fall  Required Braces or Orthoses Knee Immobilizer - Left  Knee Immobilizer - Left On when out of bed or walking  Restrictions  Weight Bearing Restrictions Yes  LLE Weight Bearing WBAT  Cognition  Overall Cognitive Status Appears within functional limits for tasks assessed/performed  Arousal/Alertness Awake/alert  Orientation Level Appears intact for tasks assessed  Behavior During Session [facility] for tasks performed  Bed Mobility  Bed Mobility Sit to Supine  Sit to Supine 5: Supervision  Details for Bed Mobility Assistance cues for encouragement, increased time needed to complete without A.    Transfers  Transfers Stand to Sit;Sit to Stand  Sit to Stand 4: Min assist;With upper extremity assist;From chair/3-in-1;With armrests  Stand to Sit 4: Min guard;With upper extremity assist;To bed  Details for Transfer Assistance cues for UE use.    Ambulation/Gait  Ambulation/Gait Assistance 4: Min guard  Ambulation Distance (Feet) 200 Feet  Assistive device Rolling walker  Ambulation/Gait Assistance Details cues for upright posture, increased heel strike.    Gait Pattern Step-to pattern;Decreased step length - right;Decreased stance time - left  Stairs No  Wheelchair Mobility  Wheelchair Mobility No  Balance  Balance Assessed No  PT - End of Session  Equipment Utilized During Treatment Gait belt;Left knee immobilizer  Activity Tolerance Patient tolerated treatment well  Patient left in bed;in CPM;with call bell/phone within reach;with family/visitor present  Nurse Communication Mobility status  PT - Assessment/Plan  Comments on Treatment Session pt presents with L TKA.  pt moving much better this pm  and very motivated to continue mobility.    PT Plan Discharge plan remains appropriate;Frequency remains appropriate  PT Frequency 7X/week  Follow Up Recommendations Home health PT;Supervision/Assistance - 24 hour  PT equipment Rolling walker with 5" wheels (3-in-1)  Acute Rehab PT Goals  Time For Goal Achievement 05/17/12  Potential to Achieve Goals Good  PT Goal: Sit to Supine/Side - Progress Progressing toward goal  PT Goal: Sit to Stand - Progress Progressing toward goal  PT Goal: Stand to Sit - Progress Progressing toward goal  PT Goal: Ambulate - Progress Progressing toward goal  PT General Charges  $$ ACUTE PT VISIT 1 Procedure  PT Treatments  $Gait Training 23-37 mins   [place] [phone]

## 2012-05-10 NOTE — Progress Notes (Signed)
Subjective: 1 Day Post-Op Procedure(s) (LRB): TOTAL KNEE ARTHROPLASTY (Left)  Activity level:  oob wbat Diet tolerance:  ok Voiding:  ok Patient reports pain as 2 on 0-10 scale.    Objective: Vital signs in last 24 hours: Temp:  [97.4 F (36.3 C)-98.1 F (36.7 C)] 98.1 F (36.7 C) (02/19 0500) Pulse Rate:  [66-99] 99 (02/19 0500) Resp:  [8-20] 18 (02/19 0500) BP: (107-145)/(58-92) 128/92 mmHg (02/19 0500) SpO2:  [95 %-99 %] 99 % (02/19 0500)  Labs:  Recent Labs  05/10/12 0425  HGB 12.7*    Recent Labs  05/10/12 0425  WBC 12.2*  RBC 4.25  HCT 36.7*  PLT 221    Recent Labs  05/10/12 0425  NA 137  K 4.2  CL 101  CO2 28  BUN 11  CREATININE 0.78  GLUCOSE 168*  CALCIUM 8.7   No results found for this basename: LABPT, INR,  in the last 72 hours  Physical Exam:  Neurologically intact ABD soft Neurovascular intact Sensation intact distally Intact pulses distally Dorsiflexion/Plantar flexion intact Incision: dressing C/D/I No cellulitis present Compartment soft  Assessment/Plan:  1 Day Post-Op Procedure(s) (LRB): TOTAL KNEE ARTHROPLASTY (Left) Advance diet Up with therapy D/C IV fluids Plan for discharge tomorrow if ok with therapy ASA 325 BID x 2 weeks    Mathew Clements R 05/10/2012, 7:42 AM

## 2012-05-10 NOTE — Evaluation (Signed)
Occupational Therapy Evaluation Patient Details Name: Mathew Clements MRN: [id number] DOB: 02-08-1946 Today's Date: 05/10/2012 Time: 4098-1191 OT Time Calculation (min): 17 min  OT Assessment / Plan / Recommendation Clinical Impression  Pt admitted for L TKA.  All education completed. Requiring min assist for mobility and ADL.  Will have wife available to assist.  No further OT needs.    OT Assessment  Patient does not need any further OT services    Follow Up Recommendations  No OT follow up    Barriers to Discharge      Equipment Recommendations   3 in 1 already in room    Recommendations for Other Services    Frequency       Precautions / Restrictions Precautions Precautions: Knee;Fall Precaution Booklet Issued: Yes (comment) Required Braces or Orthoses: Knee Immobilizer - Left Knee Immobilizer - Left: On when out of bed or walking Restrictions Weight Bearing Restrictions: Yes LLE Weight Bearing: Weight bearing as tolerated   Pertinent Vitals/Pain L knee, did not rate, premedicated, repositioned    ADL  Eating/Feeding: Independent Where Assessed - Eating/Feeding: Chair Grooming: Wash/dry hands;Wash/dry face;Set up Where Assessed - Grooming: Unsupported sitting Upper Body Bathing: Set up Where Assessed - Upper Body Bathing: Unsupported sitting Lower Body Bathing: Minimal assistance Where Assessed - Lower Body Bathing: Unsupported sitting;Supported sit to stand Upper Body Dressing: Set up Where Assessed - Upper Body Dressing: Unsupported sitting Lower Body Dressing: Minimal assistance Where Assessed - Lower Body Dressing: Unsupported sitting;Supported sit to stand Tub/Shower Transfer:  (verbally instructed pt in technique leading with R LE) Tub/Shower Transfer Equipment:  (pt is aware he may use 3 in 1) Equipment Used: Rolling walker;Knee Immobilizer ADL Comments: Pt will rely on his wife to assist with LB ADL and to supervise shower transfer.    OT Diagnosis:     OT Problem List:   OT Treatment Interventions:     OT Goals    Visit Information  Last OT Received On: 05/10/12 Assistance Needed: +2 (helpful for chair follow.  )    Subjective Data  Subjective: "My wife can help me reach my feet, I don't need gadgets." Patient Stated Goal: Climb ladders.   Prior Functioning     Home Living Lives With: Spouse Available Help at Discharge: Family;Available 24 hours/day Type of Home: House Home Access: Stairs to enter [employer] of Steps: 5 Entrance Stairs-Rails: Left Home Layout: One level Bathroom Shower/Tub: Health visitor: Standard Home Adaptive Equipment: Hand-held shower hose Prior Function Level of Independence: Independent Able to Take Stairs?: Yes Driving: Yes Vocation: Full time employment Communication Communication: No difficulties Dominant Hand: Right         Vision/Perception     Cognition  Cognition Overall Cognitive Status: Appears within functional limits for tasks assessed/performed Arousal/Alertness: Awake/alert Orientation Level: Appears intact for tasks assessed Behavior During Session: [facility] for tasks performed    Extremity/Trunk Assessment Right Upper Extremity Assessment RUE ROM/Strength/Tone: Within functional levels Left Upper Extremity Assessment LUE ROM/Strength/Tone: Within functional levels  Trunk Assessment Trunk Assessment: Normal     Mobility Bed Mobility Bed Mobility: Not assessed Transfers Transfers: Sit to Stand;Stand to Sit Sit to Stand: 4: Min assist;With upper extremity assist;From chair/3-in-1 Stand to Sit: 4: Min assist;With upper extremity assist;To chair/3-in-1 Details for Transfer Assistance: cues for UE use, positioning of LEs, controlling descent to chair.       Exercise    Balance Balance Balance Assessed: No   End of Session OT - End of  Session Activity Tolerance: Patient tolerated treatment well Patient left: in chair;with call  bell/phone within reach;with family/visitor present CPM Left Knee CPM Left Knee: Off  GO     Evern Bio 05/10/2012, 2:41 PM [phone]

## 2012-05-11 ENCOUNTER — Encounter (HOSPITAL_COMMUNITY): Payer: Self-pay | Admitting: Orthopaedic Surgery

## 2012-05-11 LAB — CBC
MCHC: 34.6 g/dL (ref 30.0–36.0)
Platelets: 216 10*3/uL (ref 150–400)
RDW: 12.8 % (ref 11.5–15.5)

## 2012-05-11 MED ORDER — ASPIRIN 325 MG PO TBEC: 325.0000 mg | DELAYED_RELEASE_TABLET | Freq: Two times a day (BID) | ORAL | Status: DC

## 2012-05-11 MED ORDER — OXYCODONE HCL 5 MG PO TABS: 5.0000 mg | ORAL_TABLET | ORAL | Status: DC | PRN

## 2012-05-11 MED ORDER — METHOCARBAMOL 500 MG PO TABS: 500.0000 mg | ORAL_TABLET | Freq: Four times a day (QID) | ORAL | Status: DC | PRN

## 2012-05-11 MED FILL — Acetaminophen IV Soln 10 MG/ML: INTRAVENOUS | Qty: 100 | Status: AC

## 2012-05-11 NOTE — Progress Notes (Signed)
Subjective: 2 Days Post-Op Procedure(s) (LRB): TOTAL KNEE ARTHROPLASTY (Left)  Activity level:  wbat Diet tolerance:  ok Voiding:  ok Patient reports pain as 2 on 0-10 scale.    Objective: Vital signs in last 24 hours: Temp:  [97.6 F (36.4 C)-97.9 F (36.6 C)] 97.6 F (36.4 C) (02/20 0642) Pulse Rate:  [92-98] 92 (02/20 0642) Resp:  [18] 18 (02/20 0642) BP: (118-130)/(68-90) 118/68 mmHg (02/20 1026) SpO2:  [99 %-100 %] 100 % (02/20 0642)  Labs:  Recent Labs  05/10/12 0425 05/11/12 0520  HGB 12.7* 11.8*    Recent Labs  05/10/12 0425 05/11/12 0520  WBC 12.2* 13.1*  RBC 4.25 3.97*  HCT 36.7* 34.1*  PLT 221 216    Recent Labs  05/10/12 0425  NA 137  K 4.2  CL 101  CO2 28  BUN 11  CREATININE 0.78  GLUCOSE 168*  CALCIUM 8.7   No results found for this basename: LABPT, INR,  in the last 72 hours  Physical Exam:  Neurologically intact ABD soft Neurovascular intact Sensation intact distally Intact pulses distally Dorsiflexion/Plantar flexion intact Incision: dressing C/D/I and no drainage No cellulitis present Compartment soft Dressing changed  Assessment/Plan:  2 Days Post-Op Procedure(s) (LRB): TOTAL KNEE ARTHROPLASTY (Left) Advance diet Up with therapy D/C IV fluids Discharge home with home health    Mathew Clements 05/11/2012, 12:42 PM

## 2012-05-11 NOTE — Progress Notes (Signed)
CARE MANAGEMENT NOTE 05/11/2012  Patient:  Mathew Clements, Mathew Clements   Account Number:  [id number]  Date Initiated:  05/11/2012  Documentation initiated by:  Vance Peper  Subjective/Objective Assessment:   67 yr old male s/p left total knee arthroplasty.     Action/Plan:   Patient preoperatively setup with [facility], no changes. DME has been delivered.   Anticipated DC Date:  05/11/2012   Anticipated DC Plan:  HOME W HOME HEALTH SERVICES      DC Planning Services  CM consult      [facility] Choice  HOME HEALTH   Choice offered to / List presented to:  C-1 Patient        HH arranged  HH-2 PT      [facility] agency  [facility]   Status of service:  Completed, signed off Medicare Important Message given?   (If response is "NO", the following Medicare IM given date fields will be blank) Date Medicare IM given:   Date Additional Medicare IM given:    Discharge Disposition:  HOME W HOME HEALTH SERVICES  Per UR Regulation:    If discussed at Long Length of Stay Meetings, dates discussed:    Comments:

## 2012-05-11 NOTE — Progress Notes (Signed)
Physical Therapy Treatment Patient Details Name: Mathew Clements MRN: [id number] DOB: [birth year] Today's Date: 05/11/2012 Time: 0865-7846 PT Time Calculation (min): 46 min  PT Assessment / Plan / Recommendation Comments on Treatment Session  pt presents with L TKA.  pt notes feeling nauseated and that he hasn't had pain meds since about 2 or 3am.  RN made aware.  pt moving well despite nausea.  pt ready for D/C from a mobility stand point.      Follow Up Recommendations  Home health PT;Supervision/Assistance - 24 hour     Does the patient have the potential to tolerate intense rehabilitation     Barriers to Discharge        Equipment Recommendations  Rolling walker with 5" wheels (3-in-1)    Recommendations for Other Services    Frequency 7X/week   Plan Discharge plan remains appropriate;Frequency remains appropriate    Precautions / Restrictions Precautions Precautions: Knee;Fall Required Braces or Orthoses: Knee Immobilizer - Left Knee Immobilizer - Left: On when out of bed or walking Restrictions Weight Bearing Restrictions: Yes LLE Weight Bearing: Weight bearing as tolerated   Pertinent Vitals/Pain Indicates pain 7/10.  RN made aware.      Mobility  Bed Mobility Bed Mobility: Not assessed Transfers Transfers: Stand to Sit;Sit to Stand Sit to Stand: 4: Min guard;With upper extremity assist;From bed;From chair/3-in-1;With armrests Stand to Sit: 4: Min guard;With upper extremity assist;To chair/3-in-1;With armrests Details for Transfer Assistance: Demos good technique.   Ambulation/Gait Ambulation/Gait Assistance: 4: Min guard Ambulation Distance (Feet): 250 Feet Assistive device: Rolling walker Ambulation/Gait Assistance Details: cues for upright posture.  pt indicating feeling nauseated off/on during mobility.  RN made aware.   Gait Pattern: Step-to pattern;Decreased step length - right;Decreased stance time - left Stairs: Yes Stairs Assistance: 4: Min  assist Stairs Assistance Details (indicate cue type and reason): Cues for safe technique.  pt and wife performed stairs together.   Stair Management Technique: No rails;Backwards;With walker Number of Stairs: 2 Wheelchair Mobility Wheelchair Mobility: No    Exercises     PT Diagnosis:    PT Problem List:   PT Treatment Interventions:     PT Goals Acute Rehab PT Goals Time For Goal Achievement: 05/17/12 Potential to Achieve Goals: Good PT Goal: Sit to Stand - Progress: Progressing toward goal PT Goal: Stand to Sit - Progress: Progressing toward goal PT Goal: Ambulate - Progress: Progressing toward goal PT Goal: Up/Down Stairs - Progress: Progressing toward goal  Visit Information  Last PT Received On: 05/11/12 Assistance Needed: +1    Subjective Data  Subjective: That bacon upset my stomach.     Cognition  Cognition Overall Cognitive Status: Appears within functional limits for tasks assessed/performed Arousal/Alertness: Awake/alert Orientation Level: Appears intact for tasks assessed Behavior During Session: [facility] for tasks performed    Balance  Balance Balance Assessed: No  End of Session PT - End of Session Equipment Utilized During Treatment: Gait belt;Left knee immobilizer Activity Tolerance: Patient tolerated treatment well Patient left: in chair;with call bell/phone within reach;with family/visitor present Nurse Communication: Mobility status;Patient requests pain meds   GP     Sunny Schlein, [place] [phone] 05/11/2012, 9:19 AM

## 2012-05-11 NOTE — Progress Notes (Signed)
Pt d/c to home with family.  D/C instructions and prescriptions given to wife.

## 2012-05-11 NOTE — Discharge Summary (Signed)
Patient ID: Mathew Clements MRN: [id number] DOB/AGE: 1945/05/31 67 y.o.  Admit date: 05/09/2012 Discharge date: 05/11/2012  Admission Diagnoses:  Principal Problem:   Left knee DJD   Discharge Diagnoses:  Same  Past Medical History  Diagnosis Date  . HTN (hypertension)   . Diabetes mellitus   . Anemia   . CAD (coronary artery disease)     with an ejection fraction of 65%  . History of kidney stones   . H/O: Bell's palsy   . Myocardial infarction   . Shortness of breath     with exertion  . Kidney stone   . Hyperlipemia   . Hemorrhoid   . Cancer     skin cancer right ear  . Arthritis     Surgeries: Procedure(s): TOTAL KNEE ARTHROPLASTY on 05/09/2012   Consultants:    Discharged Condition: Improved  Hospital Course: Mathew Clements is an 67 y.o. male who was admitted 05/09/2012 for operative treatment ofLeft knee DJD. Patient has severe unremitting pain that affects sleep, daily activities, and work/hobbies. After pre-op clearance the patient was taken to the operating room on 05/09/2012 and underwent  Procedure(s): TOTAL KNEE ARTHROPLASTY.    Patient was given perioperative antibiotics: Anti-infectives   Start     Dose/Rate Route Frequency Ordered Stop   05/09/12 2000  ceFAZolin (ANCEF) IVPB 2 g/50 mL premix     2 g 100 mL/hr over 30 Minutes Intravenous Every 6 hours 05/09/12 1821 05/10/12 0359   05/09/12 0600  ceFAZolin (ANCEF) 3 g in dextrose 5 % 50 mL IVPB     3 g 160 mL/hr over 30 Minutes Intravenous On call to O.R. 05/08/12 1411 05/09/12 1346       Patient was given sequential compression devices, early ambulation, and chemoprophylaxis to prevent DVT.  Patient benefited maximally from hospital stay and there were no complications.    Recent vital signs: Patient Vitals for the past 24 hrs:  BP Temp Pulse Resp SpO2  05/11/12 1026 118/68 mmHg - - - -  05/11/12 0642 125/86 mmHg 97.6 F (36.4 C) 92 18 100 %  05/10/12 2100 130/90 mmHg 97.9 F (36.6 C) 98 18 99  %     Recent laboratory studies:  Recent Labs  05/10/12 0425 05/11/12 0520  WBC 12.2* 13.1*  HGB 12.7* 11.8*  HCT 36.7* 34.1*  PLT 221 216  NA 137  --   K 4.2  --   CL 101  --   CO2 28  --   BUN 11  --   CREATININE 0.78  --   GLUCOSE 168*  --   CALCIUM 8.7  --      Discharge Medications:     Medication List    TAKE these medications       aspirin 325 MG EC tablet  Take 1 tablet (325 mg total) by mouth 2 (two) times daily.     atorvastatin 40 MG tablet  Commonly known as:  LIPITOR  Take 40 mg by mouth daily.     lisinopril 10 MG tablet  Commonly known as:  PRINIVIL,ZESTRIL  Take 10 mg by mouth daily.     metFORMIN 850 MG tablet  Commonly known as:  GLUCOPHAGE  Take 850 mg by mouth 2 (two) times daily with a meal.     methocarbamol 500 MG tablet  Commonly known as:  ROBAXIN  Take 1 tablet (500 mg total) by mouth every 6 (six) hours as needed.     metoprolol 50  MG tablet  Commonly known as:  LOPRESSOR  Take 50 mg by mouth 2 (two) times daily.     oxyCODONE 5 MG immediate release tablet  Commonly known as:  Oxy IR/ROXICODONE  Take 1-2 tablets (5-10 mg total) by mouth every 3 (three) hours as needed.        Diagnostic Studies: Dg Chest 2 View  05/02/2012  *RADIOLOGY REPORT*  Clinical Data: Preop for left total knee arthroplasty, diabetes, hypertension  CHEST - 2 VIEW  Comparison: Chest x-ray of 11/19/2010  Findings: No active infiltrate or effusion is seen.  Mediastinal contours appear stable.  The heart is within upper limits of normal.  Median sternotomy sutures are noted from prior CABG. Degenerative changes are present throughout the thoracic spine.  IMPRESSION: No active lung disease.   Original Report Authenticated By: Dwyane Dee, M.D.     Disposition: 01-Home or Self Care      Discharge Orders   Future Orders Complete By Expires     Call MD / Call 911  As directed     Comments:      If you experience chest pain or shortness of breath, CALL 911  and be transported to the hospital emergency room.  If you develope a fever above 101 F, pus (white drainage) or increased drainage or redness at the wound, or calf pain, call your surgeon's office.    Constipation Prevention  As directed     Comments:      Drink plenty of fluids.  Prune juice may be helpful.  You may use a stool softener, such as Colace (over the counter) 100 mg twice a day.  Use MiraLax (over the counter) for constipation as needed.    Diet - low sodium heart healthy  As directed     Increase activity slowly as tolerated  As directed        Follow-up Information   Follow up with Velna Ochs, MD. Schedule an appointment as soon as possible for a visit in 2 weeks.   Contact information:   [address] [place] [address] [phone]        Signed: Prince Rome 05/11/2012, 12:47 PM

## 2013-04-13 ENCOUNTER — Encounter (INDEPENDENT_AMBULATORY_CARE_PROVIDER_SITE_OTHER): Payer: Self-pay

## 2013-04-13 ENCOUNTER — Encounter: Payer: Self-pay | Admitting: Cardiology

## 2013-04-13 ENCOUNTER — Ambulatory Visit (INDEPENDENT_AMBULATORY_CARE_PROVIDER_SITE_OTHER): Payer: PRIVATE HEALTH INSURANCE | Admitting: Cardiology

## 2013-04-13 VITALS — BP 108/68 | HR 81 | Ht 69.0 in | Wt 209.1 lb

## 2013-04-13 DIAGNOSIS — I2510 Atherosclerotic heart disease of native coronary artery without angina pectoris: Secondary | ICD-10-CM

## 2013-04-13 DIAGNOSIS — I10 Essential (primary) hypertension: Secondary | ICD-10-CM

## 2013-04-13 NOTE — Progress Notes (Signed)
HPI The patient presents for follow up after CABG. Since I last saw him he has done well.  The patient denies any new symptoms such as chest discomfort, neck or arm discomfort. There has been no new shortness of breath, PND or orthopnea. There have been no reported palpitations, presyncope or syncope.  He  has an active job.  He had a TKR at the last visit. He did relatively well with this. He has no new cardiovascular symptoms since that time. He did well with this surgery though he still has some residual pain.  No Known Allergies  Current Outpatient Prescriptions  Medication Sig Dispense Refill  . aspirin 325 MG EC tablet Take 1 tablet (325 mg total) by mouth 2 (two) times daily.  30 tablet  1  . atorvastatin (LIPITOR) 40 MG tablet Take 40 mg by mouth daily.      Marland Kitchen lisinopril (PRINIVIL,ZESTRIL) 10 MG tablet Take 10 mg by mouth daily.      . metFORMIN (GLUCOPHAGE) 850 MG tablet Take 850 mg by mouth 2 (two) times daily with a meal.       . methocarbamol (ROBAXIN) 500 MG tablet Take 1 tablet (500 mg total) by mouth every 6 (six) hours as needed.  60 tablet  1  . metoprolol (LOPRESSOR) 50 MG tablet Take 50 mg by mouth 2 (two) times daily.         No current facility-administered medications for this visit.    Past Medical History  Diagnosis Date  . HTN (hypertension)   . Diabetes mellitus   . Anemia   . CAD (coronary artery disease)     with an ejection fraction of 65%  . History of kidney stones   . H/O: Bell's palsy   . Myocardial infarction   . Shortness of breath     with exertion  . Kidney stone   . Hyperlipemia   . Hemorrhoid   . Cancer     skin cancer right ear  . Arthritis     Past Surgical History  Procedure Laterality Date  . Cardiac catheterization  10/15/2010    Dr Percival Spanish  . Coronary artery bypass graft  10/20/2010    x 3, Dr Servando Snare  . Back surgery    . Total knee arthroplasty Left 05/09/2012    Procedure: TOTAL KNEE ARTHROPLASTY;  Surgeon: Hessie Dibble, MD;  Location: [facility];  Service: Orthopedics;  Laterality: Left;    ROS:  As stated in the HPI and negative for all other systems.  PHYSICAL EXAM BP 108/68  Pulse 81  Ht 5\' 9"  (1.753 m)  Wt 209 lb 1.9 oz (94.856 kg)  BMI 30.87 kg/m2 GENERAL:  Well appearing HEENT:  Pupils equal round and reactive, fundi not visualized, oral mucosa unremarkable, edentulous NECK:  No jugular venous distention, waveform within normal limits, carotid upstroke brisk and symmetric, no bruits, no thyromegaly LUNGS:  Clear to auscultation bilaterally CHEST:  Well healed sternotomy scar HEART:  PMI not displaced or sustained,S1 and S2 within normal limits, no S3, no S4, no clicks, no rubs, no murmurs ABD:  Flat, positive bowel sounds normal in frequency in pitch, no bruits, no rebound, no guarding, no midline pulsatile mass, no hepatomegaly, no splenomegaly EXT:  2 plus pulses throughout, no edema, no cyanosis no clubbing  EKG:  Sinus rhythm, rate 81, poor anterior R wave progression, no acute ST-T wave changes 04/13/2013  ASSESSMENT AND PLAN  CAD (coronary artery disease) - The patient has  no new sypmtoms. No further cardiovascular testing is indicated. We will continue with aggressive risk reduction and meds as listed.   HTN (hypertension) -  The blood pressure is at target. No change in medications is indicated. We will continue with therapeutic lifestyle changes (TLC).  Hyperlipidemia - He has this followed by Gara Kroner, MD. he is on target lipid doses.

## 2013-04-13 NOTE — Patient Instructions (Signed)
The current medical regimen is effective;  continue present plan and medications.  Follow up in 1 year with Dr Hochrein.  You will receive a letter in the mail 2 months before you are due.  Please call us when you receive this letter to schedule your follow up appointment.  

## 2013-08-01 ENCOUNTER — Emergency Department (HOSPITAL_COMMUNITY): Payer: PRIVATE HEALTH INSURANCE

## 2013-08-01 ENCOUNTER — Encounter (HOSPITAL_COMMUNITY): Payer: Self-pay | Admitting: Emergency Medicine

## 2013-08-01 ENCOUNTER — Emergency Department (HOSPITAL_COMMUNITY)
Admission: EM | Admit: 2013-08-01 | Discharge: 2013-08-01 | Disposition: A | Discharge status: Home / Self Care | Payer: PRIVATE HEALTH INSURANCE | Attending: Emergency Medicine | Admitting: Emergency Medicine

## 2013-08-01 DIAGNOSIS — Z8669 Personal history of other diseases of the nervous system and sense organs: Secondary | ICD-10-CM | POA: Insufficient documentation

## 2013-08-01 DIAGNOSIS — B349 Viral infection, unspecified: Secondary | ICD-10-CM

## 2013-08-01 DIAGNOSIS — Z7982 Long term (current) use of aspirin: Secondary | ICD-10-CM | POA: Insufficient documentation

## 2013-08-01 DIAGNOSIS — Z9889 Other specified postprocedural states: Secondary | ICD-10-CM | POA: Insufficient documentation

## 2013-08-01 DIAGNOSIS — Z79899 Other long term (current) drug therapy: Secondary | ICD-10-CM | POA: Insufficient documentation

## 2013-08-01 DIAGNOSIS — I10 Essential (primary) hypertension: Secondary | ICD-10-CM | POA: Insufficient documentation

## 2013-08-01 DIAGNOSIS — I2510 Atherosclerotic heart disease of native coronary artery without angina pectoris: Secondary | ICD-10-CM | POA: Insufficient documentation

## 2013-08-01 DIAGNOSIS — R000 Tachycardia, unspecified: Secondary | ICD-10-CM | POA: Insufficient documentation

## 2013-08-01 DIAGNOSIS — Z87442 Personal history of urinary calculi: Secondary | ICD-10-CM | POA: Insufficient documentation

## 2013-08-01 DIAGNOSIS — Z85828 Personal history of other malignant neoplasm of skin: Secondary | ICD-10-CM | POA: Insufficient documentation

## 2013-08-01 DIAGNOSIS — E785 Hyperlipidemia, unspecified: Secondary | ICD-10-CM | POA: Insufficient documentation

## 2013-08-01 DIAGNOSIS — Z951 Presence of aortocoronary bypass graft: Secondary | ICD-10-CM | POA: Insufficient documentation

## 2013-08-01 DIAGNOSIS — I252 Old myocardial infarction: Secondary | ICD-10-CM | POA: Insufficient documentation

## 2013-08-01 DIAGNOSIS — E119 Type 2 diabetes mellitus without complications: Secondary | ICD-10-CM | POA: Insufficient documentation

## 2013-08-01 DIAGNOSIS — B9789 Other viral agents as the cause of diseases classified elsewhere: Secondary | ICD-10-CM | POA: Insufficient documentation

## 2013-08-01 DIAGNOSIS — M129 Arthropathy, unspecified: Secondary | ICD-10-CM | POA: Insufficient documentation

## 2013-08-01 DIAGNOSIS — Z862 Personal history of diseases of the blood and blood-forming organs and certain disorders involving the immune mechanism: Secondary | ICD-10-CM | POA: Insufficient documentation

## 2013-08-01 LAB — CBC WITH DIFFERENTIAL/PLATELET
Basophils Absolute: 0 10*3/uL (ref 0.0–0.1)
Basophils Relative: 0 % (ref 0–1)
Eosinophils Absolute: 0.1 10*3/uL (ref 0.0–0.7)
Eosinophils Relative: 1 % (ref 0–5)
HCT: 43.9 % (ref 39.0–52.0)
HEMOGLOBIN: 15.5 g/dL (ref 13.0–17.0)
LYMPHS ABS: 0.8 10*3/uL (ref 0.7–4.0)
LYMPHS PCT: 7 % — AB (ref 12–46)
MCH: 30.4 pg (ref 26.0–34.0)
MCHC: 35.3 g/dL (ref 30.0–36.0)
MCV: 86.1 fL (ref 78.0–100.0)
MONOS PCT: 3 % (ref 3–12)
Monocytes Absolute: 0.4 10*3/uL (ref 0.1–1.0)
NEUTROS ABS: 10.3 10*3/uL — AB (ref 1.7–7.7)
NEUTROS PCT: 89 % — AB (ref 43–77)
Platelets: 209 10*3/uL (ref 150–400)
RBC: 5.1 MIL/uL (ref 4.22–5.81)
RDW: 12.7 % (ref 11.5–15.5)
WBC: 11.5 10*3/uL — ABNORMAL HIGH (ref 4.0–10.5)

## 2013-08-01 LAB — COMPREHENSIVE METABOLIC PANEL
ALT: 19 U/L (ref 0–53)
AST: 18 U/L (ref 0–37)
Albumin: 4 g/dL (ref 3.5–5.2)
Alkaline Phosphatase: 99 U/L (ref 39–117)
BUN: 13 mg/dL (ref 6–23)
CO2: 22 mEq/L (ref 19–32)
CREATININE: 0.85 mg/dL (ref 0.50–1.35)
Calcium: 9.3 mg/dL (ref 8.4–10.5)
Chloride: 104 mEq/L (ref 96–112)
GFR calc non Af Amer: 89 mL/min — ABNORMAL LOW (ref >90)
Glucose, Bld: 162 mg/dL — ABNORMAL HIGH (ref 70–99)
Potassium: 4 mEq/L (ref 3.7–5.3)
Sodium: 141 mEq/L (ref 137–147)
Total Bilirubin: 0.9 mg/dL (ref 0.3–1.2)
Total Protein: 7.4 g/dL (ref 6.0–8.3)

## 2013-08-01 LAB — URINALYSIS, ROUTINE W REFLEX MICROSCOPIC
Glucose, UA: NEGATIVE mg/dL
Hgb urine dipstick: NEGATIVE
Ketones, ur: NEGATIVE mg/dL
LEUKOCYTES UA: NEGATIVE
NITRITE: NEGATIVE
PH: 5 (ref 5.0–8.0)
Protein, ur: NEGATIVE mg/dL
Specific Gravity, Urine: 1.026 (ref 1.005–1.030)
Urobilinogen, UA: 0.2 mg/dL (ref 0.0–1.0)

## 2013-08-01 LAB — CBG MONITORING, ED: Glucose-Capillary: 150 mg/dL — ABNORMAL HIGH (ref 70–99)

## 2013-08-01 MED ORDER — ACETAMINOPHEN 325 MG PO TABS
650.0000 mg | ORAL_TABLET | Freq: Once | ORAL | Status: AC
  Administered 2013-08-01: 650 mg via ORAL
  Filled 2013-08-01: qty 2

## 2013-08-01 MED ORDER — IBUPROFEN 200 MG PO TABS
400.0000 mg | ORAL_TABLET | Freq: Once | ORAL | Status: AC
  Administered 2013-08-01: 400 mg via ORAL
  Filled 2013-08-01: qty 2

## 2013-08-01 NOTE — ED Notes (Addendum)
Pt reports feel dizzy and having rails; severe chills. Pt is afebrile. Pt reports that it came on suddenly around 12:30. Diabetic. CBG 150. Neurologically intact; no drifts; equal grips; family reports speech is normal; no droop noted.

## 2013-08-01 NOTE — ED Provider Notes (Signed)
CSN: [id number]     Arrival date & time 08/01/13  1359 History   First MD Initiated Contact with Patient 08/01/13 1828     Chief Complaint  Patient presents with  . Dizziness     (Consider location/radiation/quality/duration/timing/severity/associated sxs/prior Treatment) Patient is a 68 y.o. male presenting with dizziness. The history is provided by the patient and the spouse.  Dizziness  patient here complaining of acute onset of Rigors that began this afternoon. He has had positive sick exposures at home. He had emesis x1 without diarrhea. No cough or congestion. No dysuria or hematuria. No neck pain or photophobia. Denies any severe headaches. No rashes noted. No recent travel history. Nothing made her symptoms better worse. No treatment used prior to arrival. On arrival here, he was noted to be febrile and given Tylenol and does feel better at this time.  Past Medical History  Diagnosis Date  . HTN (hypertension)   . Diabetes mellitus   . Anemia   . CAD (coronary artery disease)     with an ejection fraction of 65%  . History of kidney stones   . H/O: Bell's palsy   . Myocardial infarction   . Shortness of breath     with exertion  . Kidney stone   . Hyperlipemia   . Hemorrhoid   . Cancer     skin cancer right ear  . Arthritis    Past Surgical History  Procedure Laterality Date  . Cardiac catheterization  10/15/2010    Dr Percival Spanish  . Coronary artery bypass graft  10/20/2010    x 3, Dr Servando Snare  . Back surgery    . Total knee arthroplasty Left 05/09/2012    Procedure: TOTAL KNEE ARTHROPLASTY;  Surgeon: Hessie Dibble, MD;  Location: [facility];  Service: Orthopedics;  Laterality: Left;   Family History  Problem Relation Age of Onset  . Diabetes Mother   . Stroke Brother   . Heart attack Sister    History  Substance Use Topics  . Smoking status: Never Smoker   . Smokeless tobacco: Never Used  . Alcohol Use: No    Review of Systems  Neurological: Positive for  dizziness.  All other systems reviewed and are negative.     Allergies  Review of patient's allergies indicates no known allergies.  Home Medications   Prior to Admission medications   Medication Sig Start Date End Date Taking? Authorizing Provider  aspirin 325 MG EC tablet Take 325 mg by mouth daily. 05/11/12  Yes Otelia Limes Carnaghi, PA-C  atorvastatin (LIPITOR) 40 MG tablet Take 40 mg by mouth daily.   Yes Historical Provider, MD  lisinopril (PRINIVIL,ZESTRIL) 10 MG tablet Take 10 mg by mouth daily.   Yes Historical Provider, MD  metFORMIN (GLUCOPHAGE) 850 MG tablet Take 850 mg by mouth 2 (two) times daily with a meal.    Yes Historical Provider, MD  metoprolol (LOPRESSOR) 50 MG tablet Take 50 mg by mouth 2 (two) times daily.     Yes Historical Provider, MD   BP 154/76  Pulse 101  Temp(Src) 99.3 F (37.4 C) (Oral)  Resp 18  SpO2 99% Physical Exam  Nursing note and vitals reviewed. Constitutional: He is oriented to person, place, and time. He appears well-developed and well-nourished.  Non-toxic appearance. No distress.  HENT:  Head: Normocephalic and atraumatic.  Eyes: Conjunctivae, EOM and lids are normal. Pupils are equal, round, and reactive to light.  Neck: Normal range of motion. Neck supple. No  tracheal deviation present. No mass present.  Cardiovascular: Regular rhythm and normal heart sounds.  Tachycardia present.  Exam reveals no gallop.   No murmur heard. Pulmonary/Chest: Effort normal and breath sounds normal. No stridor. No respiratory distress. He has no decreased breath sounds. He has no wheezes. He has no rhonchi. He has no rales.  Abdominal: Soft. Normal appearance and bowel sounds are normal. He exhibits no distension. There is no tenderness. There is no rebound and no CVA tenderness.  Musculoskeletal: Normal range of motion. He exhibits no edema and no tenderness.  Neurological: He is alert and oriented to person, place, and time. He has normal strength. No  cranial nerve deficit or sensory deficit. GCS eye subscore is 4. GCS verbal subscore is 5. GCS motor subscore is 6.  Skin: Skin is warm and dry. No abrasion and no rash noted.  Psychiatric: He has a normal mood and affect. His speech is normal and behavior is normal.    ED Course  Procedures (including critical care time) Labs Review Labs Reviewed  COMPREHENSIVE METABOLIC PANEL - Abnormal; Notable for the following:    Glucose, Bld 162 (*)    GFR calc non Af Amer 89 (*)    All other components within normal limits  CBC WITH DIFFERENTIAL - Abnormal; Notable for the following:    WBC 11.5 (*)    Neutrophils Relative % 89 (*)    Neutro Abs 10.3 (*)    Lymphocytes Relative 7 (*)    All other components within normal limits  CBG MONITORING, ED - Abnormal; Notable for the following:    Glucose-Capillary 150 (*)    All other components within normal limits  URINALYSIS, ROUTINE W REFLEX MICROSCOPIC    Imaging Review Dg Chest 2 View  08/01/2013   CLINICAL DATA:  Dizziness, nausea  EXAM: CHEST  2 VIEW  COMPARISON:  05/02/2012  FINDINGS: Cardiomediastinal silhouette is stable. Status post CABG. No acute infiltrate or pleural effusion. No pulmonary edema. Mild degenerative changes thoracic spine.  IMPRESSION: No active cardiopulmonary disease.   Electronically Signed   By: Lahoma Crocker M.D.   On: 08/01/2013 15:53     EKG Interpretation None      MDM   Final diagnoses:  None   Patient with out evidence of sepsis at this time. Chest x-ray urinalysis negative. CBC shows mild leukocytosis. He does have sick exposures. Suspect he has a viral process and is stable for discharge. He was treated with Tylenol and Motrin here.    Leota Jacobsen, MD 08/01/13 2017

## 2013-08-01 NOTE — Discharge Instructions (Signed)

## 2013-08-10 ENCOUNTER — Other Ambulatory Visit: Payer: Self-pay | Admitting: Family Medicine

## 2013-08-10 ENCOUNTER — Ambulatory Visit
Admission: RE | Admit: 2013-08-10 | Discharge: 2013-08-10 | Disposition: A | Discharge status: Home / Self Care | Payer: PRIVATE HEALTH INSURANCE | Source: Ambulatory Visit | Attending: Family Medicine | Admitting: Family Medicine

## 2013-08-10 DIAGNOSIS — Z77090 Contact with and (suspected) exposure to asbestos: Secondary | ICD-10-CM

## 2014-04-05 ENCOUNTER — Encounter: Payer: Self-pay | Admitting: Cardiology

## 2014-04-05 ENCOUNTER — Ambulatory Visit (INDEPENDENT_AMBULATORY_CARE_PROVIDER_SITE_OTHER): Payer: PRIVATE HEALTH INSURANCE | Admitting: Cardiology

## 2014-04-05 VITALS — BP 152/90 | HR 77 | Ht 70.0 in | Wt 212.7 lb

## 2014-04-05 DIAGNOSIS — I2510 Atherosclerotic heart disease of native coronary artery without angina pectoris: Secondary | ICD-10-CM

## 2014-04-05 DIAGNOSIS — I10 Essential (primary) hypertension: Secondary | ICD-10-CM

## 2014-04-05 MED ORDER — AMLODIPINE BESYLATE 2.5 MG PO TABS: 2.5000 mg | ORAL_TABLET | Freq: Every day | ORAL | Status: DC

## 2014-04-05 NOTE — Patient Instructions (Signed)
Your physician wants you to follow-up in: 1 Year You will receive a reminder letter in the mail two months in advance. If you don't receive a letter, please call our office to schedule the follow-up appointment.  Your physician has recommended you make the following change in your medication: Start Amlodipine 2.5 mg daily

## 2014-04-05 NOTE — Progress Notes (Signed)
HPI The patient presents for follow up after CABG. Since I last saw him he has done well.  The patient denies any new symptoms such as chest discomfort, neck or arm discomfort. There has been no new shortness of breath, PND or orthopnea. There have been no reported palpitations, presyncope or syncope.  He  has an active job.   He has no new cardiovascular symptoms since that time.  He does report his blood pressures been slightly elevated. We talked about his diet doesn't seem like he's necessarily following suggestions. He drinks sugared drinks for instance.  No Known Allergies  Current Outpatient Prescriptions  Medication Sig Dispense Refill  . aspirin 325 MG EC tablet Take 325 mg by mouth daily.    Marland Kitchen atorvastatin (LIPITOR) 40 MG tablet Take 40 mg by mouth daily.    Marland Kitchen lisinopril (PRINIVIL,ZESTRIL) 20 MG tablet Take 20 mg by mouth daily.  5  . metFORMIN (GLUCOPHAGE) 850 MG tablet Take 850 mg by mouth 2 (two) times daily with a meal.     . metoprolol (LOPRESSOR) 50 MG tablet Take 50 mg by mouth 2 (two) times daily.       No current facility-administered medications for this visit.    Past Medical History  Diagnosis Date  . HTN (hypertension)   . Diabetes mellitus   . Anemia   . CAD (coronary artery disease)     with an ejection fraction of 65%  . History of kidney stones   . H/O: Bell's palsy   . Myocardial infarction   . Shortness of breath     with exertion  . Kidney stone   . Hyperlipemia   . Hemorrhoid   . Cancer     skin cancer right ear  . Arthritis     Past Surgical History  Procedure Laterality Date  . Cardiac catheterization  10/15/2010    Dr Percival Spanish  . Coronary artery bypass graft  10/20/2010    x 3, Dr Servando Snare  . Back surgery    . Total knee arthroplasty Left 05/09/2012    Procedure: TOTAL KNEE ARTHROPLASTY;  Surgeon: Hessie Dibble, MD;  Location: [facility];  Service: Orthopedics;  Laterality: Left;    ROS:  As stated in the HPI and negative for all other  systems.  PHYSICAL EXAM BP 152/90 mmHg  Pulse 77  Ht 5\' 10"  (1.778 m)  Wt 212 lb 11.2 oz (96.48 kg)  BMI 30.52 kg/m2 GENERAL:  Well appearing HEENT:  Pupils equal round and reactive, fundi not visualized, oral mucosa unremarkable, edentulous NECK:  No jugular venous distention, waveform within normal limits, carotid upstroke brisk and symmetric, no bruits, no thyromegaly LUNGS:  Clear to auscultation bilaterally CHEST:  Well healed sternotomy scar HEART:  PMI not displaced or sustained,S1 and S2 within normal limits, no S3, no S4, no clicks, no rubs, no murmurs ABD:  Flat, positive bowel sounds normal in frequency in pitch, no bruits, no rebound, no guarding, no midline pulsatile mass, no hepatomegaly, no splenomegaly EXT:  2 plus pulses throughout, no edema, no cyanosis no clubbing NEURO:  Nonfocal   EKG:  Sinus rhythm, rate 77, poor anterior R wave progression, no acute ST-T wave changes 04/05/2014  ASSESSMENT AND PLAN  CAD (coronary artery disease) - The patient has no new sypmtoms. No further cardiovascular testing is indicated. We will continue with aggressive risk reduction and meds as listed.   HTN (hypertension) -  His blood pressure is not at target.  I will  add Norvasc 2.5 mg daily.  Hyperlipidemia - He has this followed by Gara Kroner, MD. he is on target lipid doses.   We talked about this and the need for diet and exercise.         HPI The patient presents for follow up after CABG. Since I last saw him he has done well.  The patient denies any new symptoms such as chest discomfort, neck or arm discomfort. There has been no new shortness of breath, PND or orthopnea. There have been no reported palpitations, presyncope or syncope.  He  has an active job.  He had a TKR at the last visit. He did relatively well with this. He has no new cardiovascular symptoms since that time. He did well with this surgery though he still has some residual pain.  No Known  Allergies  Current Outpatient Prescriptions  Medication Sig Dispense Refill  . aspirin 325 MG EC tablet Take 325 mg by mouth daily.    Marland Kitchen atorvastatin (LIPITOR) 40 MG tablet Take 40 mg by mouth daily.    Marland Kitchen lisinopril (PRINIVIL,ZESTRIL) 20 MG tablet Take 20 mg by mouth daily.  5  . metFORMIN (GLUCOPHAGE) 850 MG tablet Take 850 mg by mouth 2 (two) times daily with a meal.     . metoprolol (LOPRESSOR) 50 MG tablet Take 50 mg by mouth 2 (two) times daily.      Marland Kitchen amLODipine (NORVASC) 2.5 MG tablet Take 1 tablet (2.5 mg total) by mouth daily. 30 tablet 6   No current facility-administered medications for this visit.    Past Medical History  Diagnosis Date  . HTN (hypertension)   . Diabetes mellitus   . Anemia   . CAD (coronary artery disease)     with an ejection fraction of 65%  . History of kidney stones   . H/O: Bell's palsy   . Myocardial infarction   . Shortness of breath     with exertion  . Kidney stone   . Hyperlipemia   . Hemorrhoid   . Cancer     skin cancer right ear  . Arthritis     Past Surgical History  Procedure Laterality Date  . Cardiac catheterization  10/15/2010    Dr Percival Spanish  . Coronary artery bypass graft  10/20/2010    x 3, Dr Servando Snare  . Back surgery    . Total knee arthroplasty Left 05/09/2012    Procedure: TOTAL KNEE ARTHROPLASTY;  Surgeon: Hessie Dibble, MD;  Location: [facility];  Service: Orthopedics;  Laterality: Left;    ROS:  As stated in the HPI and negative for all other systems.  PHYSICAL EXAM BP 152/90 mmHg  Pulse 77  Ht 5\' 10"  (1.778 m)  Wt 212 lb 11.2 oz (96.48 kg)  BMI 30.52 kg/m2 GENERAL:  Well appearing HEENT:  Pupils equal round and reactive, fundi not visualized, oral mucosa unremarkable, edentulous NECK:  No jugular venous distention, waveform within normal limits, carotid upstroke brisk and symmetric, no bruits, no thyromegaly LUNGS:  Clear to auscultation bilaterally CHEST:  Well healed sternotomy scar HEART:  PMI not  displaced or sustained,S1 and S2 within normal limits, no S3, no S4, no clicks, no rubs, no murmurs ABD:  Flat, positive bowel sounds normal in frequency in pitch, no bruits, no rebound, no guarding, no midline pulsatile mass, no hepatomegaly, no splenomegaly EXT:  2 plus pulses throughout, no edema, no cyanosis no clubbing  EKG:  Sinus rhythm, rate 81, poor anterior R wave  progression, no acute ST-T wave changes 04/05/2014  ASSESSMENT AND PLAN  CAD (coronary artery disease) - The patient has no new sypmtoms. No further cardiovascular testing is indicated. We will continue with aggressive risk reduction and meds as listed.   HTN (hypertension) -  The blood pressure is at target. No change in medications is indicated. We will continue with therapeutic lifestyle changes (TLC).  Hyperlipidemia - He has this followed by Gara Kroner, MD. he is on target lipid doses.

## 2014-11-04 ENCOUNTER — Other Ambulatory Visit: Payer: Self-pay | Admitting: Cardiology

## 2015-04-13 NOTE — Progress Notes (Signed)
HPI The patient presents for follow up after CABG. Since I last saw him he has done well  He has an active job.  He is still drinking sugar drinks.  (Coke about two per day.)  The patient denies any new symptoms such as chest discomfort, neck or arm discomfort. There has been no new shortness of breath, PND or orthopnea. There have been no reported palpitations, presyncope or syncope.  He was in the ED in May with a viral illness.  No Known Allergies  Current Outpatient Prescriptions  Medication Sig Dispense Refill  . amLODipine (NORVASC) 2.5 MG tablet TAKE 1 TABLET (2.5 MG TOTAL) BY MOUTH DAILY. 30 tablet 6  . aspirin 325 MG EC tablet Take 325 mg by mouth daily.    Marland Kitchen atorvastatin (LIPITOR) 40 MG tablet Take 40 mg by mouth daily.    Marland Kitchen losartan (COZAAR) 50 MG tablet Take 50 mg by mouth daily.    . metFORMIN (GLUCOPHAGE) 850 MG tablet Take 850 mg by mouth 2 (two) times daily with a meal.     . methocarbamol (ROBAXIN) 750 MG tablet Take 750 mg by mouth as needed for muscle spasms.    . metoprolol (LOPRESSOR) 50 MG tablet Take 50 mg by mouth 2 (two) times daily.       No current facility-administered medications for this visit.    Past Medical History  Diagnosis Date  . HTN (hypertension)   . Diabetes mellitus   . Anemia   . CAD (coronary artery disease)     with an ejection fraction of 65%  . History of kidney stones   . H/O: Bell's palsy   . Myocardial infarction ([place])   . Shortness of breath     with exertion  . Kidney stone   . Hyperlipemia   . Hemorrhoid   . Cancer ([place])     skin cancer right ear  . Arthritis     Past Surgical History  Procedure Laterality Date  . Cardiac catheterization  10/15/2010    Dr Percival Spanish  . Coronary artery bypass graft  10/20/2010    x 3, Dr Servando Snare  . Back surgery    . Total knee arthroplasty Left 05/09/2012    Procedure: TOTAL KNEE ARTHROPLASTY;  Surgeon: Hessie Dibble, MD;  Location: [facility];  Service: Orthopedics;  Laterality: Left;      ROS:  As stated in the HPI and negative for all other systems.  PHYSICAL EXAM BP 146/84 mmHg  Pulse 69  Ht 5\' 10"  (1.778 m)  Wt 217 lb (98.431 kg)  BMI 31.14 kg/m2 GENERAL:  Well appearing HEENT:  Pupils equal round and reactive, fundi not visualized, oral mucosa unremarkable, edentulous NECK:  No jugular venous distention, waveform within normal limits, carotid upstroke brisk and symmetric, no bruits, no thyromegaly LUNGS:  Clear to auscultation bilaterally CHEST:  Well healed sternotomy scar HEART:  PMI not displaced or sustained,S1 and S2 within normal limits, no S3, no S4, no clicks, no rubs, no murmurs ABD:  Flat, positive bowel sounds normal in frequency in pitch, no bruits, no rebound, no guarding, no midline pulsatile mass, no hepatomegaly, no splenomegaly EXT:  2 plus pulses throughout, no edema, no cyanosis no clubbing NEURO:  Nonfocal   ASSESSMENT AND PLAN  CAD (coronary artery disease) -   He has no new symptoms.  He needs lifestyle modification we discussed this   I will reduce the ASA to 81 mg.    HTN (hypertension) -  His  blood pressure is slightly elevated. He's going to keep a blood pressure diary. I told him to lose weight and this should bring target. I'm going to check a lipid profile today.  Hyperlipidemia - He has this followed by Gara Kroner, MD.   He is on target lipid doses.   I'm going to check a lipid profile today.

## 2015-04-14 ENCOUNTER — Ambulatory Visit (INDEPENDENT_AMBULATORY_CARE_PROVIDER_SITE_OTHER): Payer: PRIVATE HEALTH INSURANCE | Admitting: Cardiology

## 2015-04-14 ENCOUNTER — Encounter: Payer: Self-pay | Admitting: Cardiology

## 2015-04-14 VITALS — BP 146/84 | HR 69 | Ht 70.0 in | Wt 217.0 lb

## 2015-04-14 DIAGNOSIS — E785 Hyperlipidemia, unspecified: Secondary | ICD-10-CM

## 2015-04-14 DIAGNOSIS — Z79899 Other long term (current) drug therapy: Secondary | ICD-10-CM | POA: Diagnosis not present

## 2015-04-14 LAB — HEPATIC FUNCTION PANEL
ALK PHOS: 79 U/L (ref 40–115)
ALT: 14 U/L (ref 9–46)
AST: 11 U/L (ref 10–35)
Albumin: 4.2 g/dL (ref 3.6–5.1)
BILIRUBIN DIRECT: 0.2 mg/dL (ref <=0.2)
BILIRUBIN INDIRECT: 0.5 mg/dL (ref 0.2–1.2)
Total Bilirubin: 0.7 mg/dL (ref 0.2–1.2)
Total Protein: 6.4 g/dL (ref 6.1–8.1)

## 2015-04-14 LAB — LIPID PANEL
Cholesterol: 88 mg/dL — ABNORMAL LOW (ref 125–200)
HDL: 35 mg/dL — ABNORMAL LOW (ref >=40)
LDL CALC: 36 mg/dL (ref <130)
TRIGLYCERIDES: 84 mg/dL (ref <150)
Total CHOL/HDL Ratio: 2.5 Ratio (ref <=5.0)
VLDL: 17 mg/dL (ref <30)

## 2015-04-14 NOTE — Patient Instructions (Signed)
Dr Percival Spanish has recommended making the following medication changes: DECREASE Aspirin to 81 mg daily  Your physician recommends that you return for lab work at your earliest [place].  Dr Percival Spanish recommends that you schedule a follow-up appointment in 1 year. You will receive a reminder letter in the mail two months in advance. If you don't receive a letter, please call our office to schedule the follow-up appointment.  If you need a refill on your cardiac medications before your next appointment, please call your pharmacy.

## 2015-04-25 ENCOUNTER — Encounter: Payer: Self-pay | Admitting: *Deleted

## 2015-06-18 ENCOUNTER — Other Ambulatory Visit: Payer: Self-pay | Admitting: Cardiology

## 2015-06-18 NOTE — Telephone Encounter (Signed)
Rx(s) sent to pharmacy electronically.  

## 2015-08-11 ENCOUNTER — Encounter (HOSPITAL_COMMUNITY): Payer: Self-pay | Admitting: Emergency Medicine

## 2015-08-11 ENCOUNTER — Ambulatory Visit (HOSPITAL_COMMUNITY)
Admission: EM | Admit: 2015-08-11 | Discharge: 2015-08-11 | Disposition: A | Discharge status: Home / Self Care | Payer: PRIVATE HEALTH INSURANCE | Attending: Family Medicine | Admitting: Family Medicine

## 2015-08-11 ENCOUNTER — Ambulatory Visit (INDEPENDENT_AMBULATORY_CARE_PROVIDER_SITE_OTHER): Payer: PRIVATE HEALTH INSURANCE

## 2015-08-11 DIAGNOSIS — S93401A Sprain of unspecified ligament of right ankle, initial encounter: Secondary | ICD-10-CM

## 2015-08-11 NOTE — ED Provider Notes (Signed)
CSN: [id number]     Arrival date & time 08/11/15  1354 History   First MD Initiated Contact with Patient 08/11/15 1554     Chief Complaint  Patient presents with  . Foot Pain   (Consider location/radiation/quality/duration/timing/severity/associated sxs/prior Treatment) HPI Comments: 70 year old male presents with pain in the right foot and ankle. States 2d while playing with his grandson he developed a rather sudden pain or discomfort to the right lateral ankle. He states yesterday he noticed that there was edema involving the ankle and the foot. There is minor pain to the foot but the majority of the pain and discomfort is in the lateral ankle. He states that at one point he got up quickly and thought he felt something in the ankle but did not recall a severe type pain.   Past Medical History  Diagnosis Date  . HTN (hypertension)   . Diabetes mellitus   . Anemia   . CAD (coronary artery disease)     with an ejection fraction of 65%  . History of kidney stones   . H/O: Bell's palsy   . Myocardial infarction ([place])   . Shortness of breath     with exertion  . Kidney stone   . Hyperlipemia   . Hemorrhoid   . Cancer ([place])     skin cancer right ear  . Arthritis    Past Surgical History  Procedure Laterality Date  . Cardiac catheterization  10/15/2010    Dr Percival Spanish  . Coronary artery bypass graft  10/20/2010    x 3, Dr Servando Snare  . Back surgery    . Total knee arthroplasty Left 05/09/2012    Procedure: TOTAL KNEE ARTHROPLASTY;  Surgeon: Hessie Dibble, MD;  Location: [facility];  Service: Orthopedics;  Laterality: Left;   Family History  Problem Relation Age of Onset  . Diabetes Mother   . Stroke Brother   . Heart attack Sister    Social History  Substance Use Topics  . Smoking status: Never Smoker   . Smokeless tobacco: Never Used  . Alcohol Use: No    Review of Systems  Constitutional: Negative.   Respiratory: Negative.   Gastrointestinal: Negative.   Genitourinary:  Negative.   Musculoskeletal: Positive for joint swelling and gait problem. Negative for neck pain and neck stiffness.       As per HPI  Skin: Negative.   Neurological: Negative for dizziness, weakness, numbness and headaches.  All other systems reviewed and are negative.   Allergies  Review of patient's allergies indicates no known allergies.  Home Medications   Prior to Admission medications   Medication Sig Start Date End Date Taking? Authorizing Provider  amLODipine (NORVASC) 2.5 MG tablet TAKE 1 TABLET (2.5 MG TOTAL) BY MOUTH DAILY. 06/18/15   Minus Breeding, MD  aspirin 325 MG EC tablet Take 325 mg by mouth daily. 05/11/12   Roselee Nova, PA-C  atorvastatin (LIPITOR) 40 MG tablet Take 40 mg by mouth daily.    Historical Provider, MD  losartan (COZAAR) 50 MG tablet Take 50 mg by mouth daily.    Historical Provider, MD  metFORMIN (GLUCOPHAGE) 850 MG tablet Take 850 mg by mouth 2 (two) times daily with a meal.     Historical Provider, MD  methocarbamol (ROBAXIN) 750 MG tablet Take 750 mg by mouth as needed for muscle spasms.    Historical Provider, MD  metoprolol (LOPRESSOR) 50 MG tablet Take 50 mg by mouth 2 (two) times daily.  Historical Provider, MD   Meds Ordered and Administered this Visit  Medications - No data to display  BP 149/83 mmHg  Pulse 87  Temp(Src) 98.9 F (37.2 C) (Oral)  Resp 12  SpO2 97% No data found.   Physical Exam  Constitutional: He is oriented to person, place, and time. He appears well-developed and well-nourished. No distress.  Neck: Normal range of motion. Neck supple.  Pulmonary/Chest: Effort normal.  Musculoskeletal: Normal range of motion. He exhibits edema and tenderness.  Minor ecchymosis to the right lateral ankle. 1+ edema to the ankle and foot. No bony tenderness to the ankle. No bony tenderness or deformity to the foot. Distal neurovascular motor Sentry is intact. Normal warmth and color. No leg pain or tenderness.  Neurological:  He is alert and oriented to person, place, and time.  Skin: Skin is warm and dry.  Psychiatric: He has a normal mood and affect.  Nursing note and vitals reviewed.   ED Course  Procedures (including critical care time)  Labs Review Labs Reviewed - No data to display  Imaging Review Dg Ankle Complete Right  08/11/2015  CLINICAL DATA:  Sudden ankle pain. Tenderness and swelling. Symptoms began last Thursday. Twisting injury. EXAM: RIGHT ANKLE - COMPLETE 3+ VIEW COMPARISON:  None. FINDINGS: Degenerative changes within the right ankle. Mild diffuse soft tissue swelling about the ankle. No fracture, subluxation or dislocation. IMPRESSION: Degenerative changes.  No acute bony abnormality. Electronically Signed   By: Rolm Baptise M.D.   On: 08/11/2015 16:52     Visual Acuity Review  Right Eye Distance:   Left Eye Distance:   Bilateral Distance:    Right Eye Near:   Left Eye Near:    Bilateral Near:         MDM   1. Ankle sprain, right, initial encounter    RICE ASO Limit wt bearing for 2-4 days.    Janne Napoleon, NP 08/11/15 1718

## 2015-08-11 NOTE — Discharge Instructions (Signed)
Ankle Sprain °An ankle sprain is an injury to the strong, fibrous tissues (ligaments) that hold your ankle bones together.  °HOME CARE  °· Put ice on your ankle for 1-2 days or as told by your doctor. °¨ Put ice in a plastic bag. °¨ Place a towel between your skin and the bag. °¨ Leave the ice on for 15-20 minutes at a time, every 2 hours while you are awake. °· Only take medicine as told by your doctor. °· Raise (elevate) your injured ankle above the level of your heart as much as possible for 2-3 days. °· Use crutches if your doctor tells you to. Slowly put your own weight on the affected ankle. Use the crutches until you can walk without pain. °· If you have a plaster splint: °¨ Do not rest it on anything harder than a pillow for 24 hours. °¨ Do not put weight on it. °¨ Do not get it wet. °¨ Take it off to shower or bathe. °· If given, use an elastic wrap or support stocking for support. Take the wrap off if your toes lose feeling (numb), tingle, or turn cold or blue. °· If you have an air splint: °¨ Add or let out air to make it comfortable. °¨ Take it off at night and to shower and bathe. °¨ Wiggle your toes and move your ankle up and down often while you are wearing it. °GET HELP IF: °· You have rapidly increasing bruising or puffiness (swelling). °· Your toes feel very cold. °· You lose feeling in your foot. °· Your medicine does not help your pain. °GET HELP RIGHT AWAY IF:  °· Your toes lose feeling (numb) or turn blue. °· You have severe pain that is increasing. °MAKE SURE YOU:  °· Understand these instructions. °· Will watch your condition. °· Will get help right away if you are not doing well or get worse. °  °This information is not intended to replace advice given to you by your health care provider. Make sure you discuss any questions you have with your health care provider. °  °Document Released: 08/25/2007 Document Revised: 03/29/2014 Document Reviewed: 09/20/2011 °Elsevier Interactive Patient  Education ©2016 [employer] ° °

## 2015-08-11 NOTE — ED Notes (Signed)
Right foot swelling and pain.  Onset Thursday 5/18.  No known injury.  Able to move toes, periodic numbness, tingling and occasional shooting pain

## 2015-10-02 ENCOUNTER — Ambulatory Visit (HOSPITAL_COMMUNITY)
Admission: EM | Admit: 2015-10-02 | Discharge: 2015-10-02 | Disposition: A | Discharge status: Home / Self Care | Payer: PRIVATE HEALTH INSURANCE | Attending: Family Medicine | Admitting: Family Medicine

## 2015-10-02 ENCOUNTER — Encounter (HOSPITAL_COMMUNITY): Payer: Self-pay | Admitting: Emergency Medicine

## 2015-10-02 DIAGNOSIS — T148 Other injury of unspecified body region: Secondary | ICD-10-CM

## 2015-10-02 DIAGNOSIS — W57XXXA Bitten or stung by nonvenomous insect and other nonvenomous arthropods, initial encounter: Secondary | ICD-10-CM | POA: Diagnosis not present

## 2015-10-02 MED ORDER — MUPIROCIN 2 % EX OINT: TOPICAL_OINTMENT | CUTANEOUS | Status: DC

## 2015-10-02 NOTE — ED Notes (Signed)
PT reports he noticed a bite on his back yesterday. PT reports his daughter removed a tick from the area this afternoon. PT's wife reports area was very red and inflamed. Bite now looks pink and not infected.

## 2015-10-02 NOTE — ED Provider Notes (Signed)
CSN: [id number]     Arrival date & time 10/02/15  1831 History   First MD Initiated Contact with Patient 10/02/15 1951     Chief Complaint  Patient presents with  . Insect Bite   (Consider location/radiation/quality/duration/timing/severity/associated sxs/prior Treatment) Patient is a 70 y.o. male presenting with rash. The history is provided by the patient and the spouse.  Rash Location:  Torso Torso rash location:  Lower back Quality: itchiness and redness   Severity:  Mild Onset quality:  Gradual Duration:  1 day Progression:  Unchanged Chronicity:  New Context: insect bite/sting   Context comment:  Tick removed today by daughter. Relieved by:  None tried Worsened by:  Nothing tried Ineffective treatments:  None tried   Past Medical History  Diagnosis Date  . HTN (hypertension)   . Diabetes mellitus   . Anemia   . CAD (coronary artery disease)     with an ejection fraction of 65%  . History of kidney stones   . H/O: Bell's palsy   . Myocardial infarction ([place])   . Shortness of breath     with exertion  . Kidney stone   . Hyperlipemia   . Hemorrhoid   . Cancer ([place])     skin cancer right ear  . Arthritis    Past Surgical History  Procedure Laterality Date  . Cardiac catheterization  10/15/2010    Dr Percival Spanish  . Coronary artery bypass graft  10/20/2010    x 3, Dr Servando Snare  . Back surgery    . Total knee arthroplasty Left 05/09/2012    Procedure: TOTAL KNEE ARTHROPLASTY;  Surgeon: Hessie Dibble, MD;  Location: [facility];  Service: Orthopedics;  Laterality: Left;   Family History  Problem Relation Age of Onset  . Diabetes Mother   . Stroke Brother   . Heart attack Sister    Social History  Substance Use Topics  . Smoking status: Never Smoker   . Smokeless tobacco: Never Used  . Alcohol Use: No    Review of Systems  Constitutional: Negative.   Musculoskeletal: Negative.   Skin: Positive for rash.  All other systems reviewed and are  negative.   Allergies  Review of patient's allergies indicates no known allergies.  Home Medications   Prior to Admission medications   Medication Sig Start Date End Date Taking? Authorizing Provider  amLODipine (NORVASC) 2.5 MG tablet TAKE 1 TABLET (2.5 MG TOTAL) BY MOUTH DAILY. 06/18/15   Minus Breeding, MD  aspirin 325 MG EC tablet Take 325 mg by mouth daily. 05/11/12   Roselee Nova, PA-C  atorvastatin (LIPITOR) 40 MG tablet Take 40 mg by mouth daily.    Historical Provider, MD  losartan (COZAAR) 50 MG tablet Take 50 mg by mouth daily.    Historical Provider, MD  metFORMIN (GLUCOPHAGE) 850 MG tablet Take 850 mg by mouth 2 (two) times daily with a meal.     Historical Provider, MD  methocarbamol (ROBAXIN) 750 MG tablet Take 750 mg by mouth as needed for muscle spasms.    Historical Provider, MD  metoprolol (LOPRESSOR) 50 MG tablet Take 50 mg by mouth 2 (two) times daily.      Historical Provider, MD  mupirocin ointment (BACTROBAN) 2 % Apply to skin bid 10/02/15   Billy Fischer, MD   Meds Ordered and Administered this Visit  Medications - No data to display  BP 143/73 mmHg  Pulse 73  Temp(Src) 98 F (36.7 C) (Oral)  Resp 12  SpO2 97% No data found.   Physical Exam  Constitutional: He is oriented to person, place, and time. He appears well-developed and well-nourished. No distress.  Neurological: He is alert and oriented to person, place, and time.  Skin: Skin is warm and dry. There is erythema.  1.5 cm oval erythematous lesion to back, nontender.  Nursing note and vitals reviewed.   ED Course  Procedures (including critical care time)  Labs Review Labs Reviewed - No data to display  Imaging Review No results found.   Visual Acuity Review  Right Eye Distance:   Left Eye Distance:   Bilateral Distance:    Right Eye Near:   Left Eye Near:    Bilateral Near:         MDM   1. Insect bite        Billy Fischer, MD 10/02/15 2016

## 2016-05-06 ENCOUNTER — Other Ambulatory Visit: Payer: Self-pay | Admitting: Cardiology

## 2016-05-06 NOTE — Telephone Encounter (Signed)
Rx(s) sent to pharmacy electronically.  

## 2016-05-11 ENCOUNTER — Emergency Department (HOSPITAL_COMMUNITY)
Admission: EM | Admit: 2016-05-11 | Discharge: 2016-05-12 | Disposition: A | Discharge status: Home / Self Care | Payer: No Typology Code available for payment source | Attending: Emergency Medicine | Admitting: Emergency Medicine

## 2016-05-11 ENCOUNTER — Encounter (HOSPITAL_COMMUNITY): Payer: Self-pay | Admitting: *Deleted

## 2016-05-11 ENCOUNTER — Emergency Department (HOSPITAL_COMMUNITY): Payer: No Typology Code available for payment source

## 2016-05-11 DIAGNOSIS — E119 Type 2 diabetes mellitus without complications: Secondary | ICD-10-CM | POA: Insufficient documentation

## 2016-05-11 DIAGNOSIS — Z7982 Long term (current) use of aspirin: Secondary | ICD-10-CM | POA: Diagnosis not present

## 2016-05-11 DIAGNOSIS — Z96652 Presence of left artificial knee joint: Secondary | ICD-10-CM | POA: Insufficient documentation

## 2016-05-11 DIAGNOSIS — J111 Influenza due to unidentified influenza virus with other respiratory manifestations: Secondary | ICD-10-CM | POA: Insufficient documentation

## 2016-05-11 DIAGNOSIS — I10 Essential (primary) hypertension: Secondary | ICD-10-CM | POA: Diagnosis not present

## 2016-05-11 DIAGNOSIS — Z7984 Long term (current) use of oral hypoglycemic drugs: Secondary | ICD-10-CM | POA: Insufficient documentation

## 2016-05-11 DIAGNOSIS — Z79899 Other long term (current) drug therapy: Secondary | ICD-10-CM | POA: Diagnosis not present

## 2016-05-11 DIAGNOSIS — I252 Old myocardial infarction: Secondary | ICD-10-CM | POA: Diagnosis not present

## 2016-05-11 DIAGNOSIS — Z951 Presence of aortocoronary bypass graft: Secondary | ICD-10-CM | POA: Diagnosis not present

## 2016-05-11 DIAGNOSIS — Z85828 Personal history of other malignant neoplasm of skin: Secondary | ICD-10-CM | POA: Diagnosis not present

## 2016-05-11 DIAGNOSIS — I2510 Atherosclerotic heart disease of native coronary artery without angina pectoris: Secondary | ICD-10-CM | POA: Diagnosis not present

## 2016-05-11 LAB — CBC WITH DIFFERENTIAL/PLATELET
BASOS PCT: 1 %
Basophils Absolute: 0.1 10*3/uL (ref 0.0–0.1)
Eosinophils Absolute: 0.3 10*3/uL (ref 0.0–0.7)
Eosinophils Relative: 3 %
HEMATOCRIT: 43 % (ref 39.0–52.0)
Hemoglobin: 15.2 g/dL (ref 13.0–17.0)
Lymphocytes Relative: 11 %
Lymphs Abs: 1.1 10*3/uL (ref 0.7–4.0)
MCH: 30.3 pg (ref 26.0–34.0)
MCHC: 35.3 g/dL (ref 30.0–36.0)
MCV: 85.7 fL (ref 78.0–100.0)
Monocytes Absolute: 1.3 10*3/uL — ABNORMAL HIGH (ref 0.1–1.0)
Monocytes Relative: 13 %
NEUTROS ABS: 7.3 10*3/uL (ref 1.7–7.7)
NEUTROS PCT: 72 %
Platelets: 194 10*3/uL (ref 150–400)
RBC: 5.02 MIL/uL (ref 4.22–5.81)
RDW: 12.4 % (ref 11.5–15.5)
WBC: 10 10*3/uL (ref 4.0–10.5)

## 2016-05-11 LAB — COMPREHENSIVE METABOLIC PANEL
ALBUMIN: 3.7 g/dL (ref 3.5–5.0)
ALT: 22 U/L (ref 17–63)
ANION GAP: 12 (ref 5–15)
AST: 29 U/L (ref 15–41)
Alkaline Phosphatase: 70 U/L (ref 38–126)
BUN: 9 mg/dL (ref 6–20)
CHLORIDE: 100 mmol/L — AB (ref 101–111)
CO2: 25 mmol/L (ref 22–32)
Calcium: 8.9 mg/dL (ref 8.9–10.3)
Creatinine, Ser: 0.92 mg/dL (ref 0.61–1.24)
GFR calc Af Amer: >60 mL/min (ref >60)
GFR calc non Af Amer: >60 mL/min (ref >60)
GLUCOSE: 185 mg/dL — AB (ref 65–99)
Potassium: 3.4 mmol/L — ABNORMAL LOW (ref 3.5–5.1)
SODIUM: 137 mmol/L (ref 135–145)
Total Bilirubin: 1.2 mg/dL (ref 0.3–1.2)
Total Protein: 7 g/dL (ref 6.5–8.1)

## 2016-05-11 LAB — I-STAT CG4 LACTIC ACID, ED: Lactic Acid, Venous: 1.5 mmol/L (ref 0.5–1.9)

## 2016-05-11 MED ORDER — OSELTAMIVIR PHOSPHATE 75 MG PO CAPS: 75.0000 mg | ORAL_CAPSULE | Freq: Two times a day (BID) | ORAL | 0 refills | Status: DC

## 2016-05-11 MED ORDER — OSELTAMIVIR PHOSPHATE 75 MG PO CAPS
75.0000 mg | ORAL_CAPSULE | Freq: Once | ORAL | Status: AC
  Administered 2016-05-11: 75 mg via ORAL
  Filled 2016-05-11: qty 1

## 2016-05-11 MED ORDER — SODIUM CHLORIDE 0.9 % IV BOLUS (SEPSIS)
1000.0000 mL | Freq: Once | INTRAVENOUS | Status: AC
  Administered 2016-05-11: 1000 mL via INTRAVENOUS

## 2016-05-11 MED ORDER — KETOROLAC TROMETHAMINE 30 MG/ML IJ SOLN
30.0000 mg | Freq: Once | INTRAMUSCULAR | Status: AC
  Administered 2016-05-11: 30 mg via INTRAVENOUS
  Filled 2016-05-11: qty 1

## 2016-05-11 MED ORDER — METOCLOPRAMIDE HCL 10 MG PO TABS: 10.0000 mg | ORAL_TABLET | Freq: Once | ORAL | Status: DC

## 2016-05-11 MED ORDER — ACETAMINOPHEN 325 MG PO TABS
ORAL_TABLET | ORAL | Status: AC
  Filled 2016-05-11: qty 2

## 2016-05-11 MED ORDER — METOCLOPRAMIDE HCL 5 MG/ML IJ SOLN
10.0000 mg | Freq: Once | INTRAMUSCULAR | Status: DC
  Filled 2016-05-11: qty 2

## 2016-05-11 MED ORDER — ACETAMINOPHEN 325 MG PO TABS
650.0000 mg | ORAL_TABLET | Freq: Once | ORAL | Status: AC
  Administered 2016-05-11: 650 mg via ORAL

## 2016-05-11 MED ORDER — METOCLOPRAMIDE HCL 10 MG PO TABS
10.0000 mg | ORAL_TABLET | Freq: Once | ORAL | Status: AC
  Administered 2016-05-11: 10 mg via ORAL
  Filled 2016-05-11: qty 1

## 2016-05-11 NOTE — ED Notes (Signed)
Called for vitals x 3 without answer

## 2016-05-11 NOTE — ED Notes (Signed)
Called to do vitals and no answer.

## 2016-05-11 NOTE — ED Provider Notes (Signed)
[facility] DEPT Provider Note   CSN: [id number] Arrival date & time: 05/11/16  1836  By signing my name below, I, Oleh Genin, attest that this documentation has been prepared under the direction and in the presence of Everlene Balls, MD. Electronically Signed: Oleh Genin, Scribe. 05/11/16. 11:41 PM.   History   Chief Complaint Chief Complaint  Patient presents with  . Influenza    HPI Mathew Clements is a 71 y.o. male who presents to the ED for evaluation of flu-like symptoms. This patient states that 2 days ago he developed intermittent headaches with productive cough, rhinorrhea, and subjective fevers. Today his symptoms worsened and presented to this facility. At interview, reports chest pain "when he coughs" only. Denies any dyspnea. Reports sick contacts at home with similar symptoms in his wife and daughter at home.His daughter was flu positive.  The history is provided by the patient. No language interpreter was used.    Past Medical History:  Diagnosis Date  . Anemia   . Arthritis   . CAD (coronary artery disease)    with an ejection fraction of 65%  . Cancer ([place])    skin cancer right ear  . Diabetes mellitus   . H/O: Bell's palsy   . Hemorrhoid   . History of kidney stones   . HTN (hypertension)   . Hyperlipemia   . Kidney stone   . Myocardial infarction   . Shortness of breath    with exertion    Patient Active Problem List   Diagnosis Date Noted  . Left knee DJD 05/09/2012    Class: Chronic  . Hyperlipidemia 11/20/2010  . Obesity 11/20/2010  . HTN (hypertension)   . Diabetes mellitus   . Anemia   . CAD (coronary artery disease)   . History of kidney stones   . H/O: Bell's palsy     Past Surgical History:  Procedure Laterality Date  . BACK SURGERY    . CARDIAC CATHETERIZATION  10/15/2010   Dr Percival Spanish  . CORONARY ARTERY BYPASS GRAFT  10/20/2010   x 3, Dr Servando Snare  . TOTAL KNEE ARTHROPLASTY Left 05/09/2012   Procedure: TOTAL KNEE  ARTHROPLASTY;  Surgeon: Hessie Dibble, MD;  Location: [facility];  Service: Orthopedics;  Laterality: Left;       Home Medications    Prior to Admission medications   Medication Sig Start Date End Date Taking? Authorizing Provider  amLODipine (NORVASC) 2.5 MG tablet Take 1 tablet (2.5 mg total) by mouth daily. <PLEASE MAKE APPOINTMENT FOR REFILLS> 05/06/16   Minus Breeding, MD  aspirin 325 MG EC tablet Take 325 mg by mouth daily. 05/11/12   Roselee Nova, PA-C  atorvastatin (LIPITOR) 40 MG tablet Take 40 mg by mouth daily.    Historical Provider, MD  losartan (COZAAR) 50 MG tablet Take 50 mg by mouth daily.    Historical Provider, MD  metFORMIN (GLUCOPHAGE) 850 MG tablet Take 850 mg by mouth 2 (two) times daily with a meal.     Historical Provider, MD  methocarbamol (ROBAXIN) 750 MG tablet Take 750 mg by mouth as needed for muscle spasms.    Historical Provider, MD  metoprolol (LOPRESSOR) 50 MG tablet Take 50 mg by mouth 2 (two) times daily.      Historical Provider, MD  mupirocin ointment (BACTROBAN) 2 % Apply to skin bid 10/02/15   Billy Fischer, MD    Family History Family History  Problem Relation Age of Onset  . Diabetes Mother   .  Stroke Brother   . Heart attack Sister     Social History Social History  Substance Use Topics  . Smoking status: Never Smoker  . Smokeless tobacco: Never Used  . Alcohol use No     Allergies   Patient has no known allergies.   Review of Systems Review of Systems 10 Systems reviewed and all are negative for acute change except as noted in the HPI.  Physical Exam Updated Vital Signs BP (!) 163/101   Pulse 114   Temp 102.6 F (39.2 C) (Oral)   Resp 20   SpO2 95%   Physical Exam  Constitutional: He is oriented to person, place, and time. Vital signs are normal. He appears well-developed and well-nourished.  Non-toxic appearance. He does not appear ill. No distress.  Tactile fever.   HENT:  Head: Normocephalic and atraumatic.    Nose: Nose normal.  Mouth/Throat: Oropharynx is clear and moist. No oropharyngeal exudate.  Eyes: Conjunctivae and EOM are normal. Pupils are equal, round, and reactive to light. No scleral icterus.  Neck: Normal range of motion. Neck supple. No tracheal deviation, no edema, no erythema and normal range of motion present. No thyroid mass and no thyromegaly present.  Cardiovascular: Regular rhythm, S1 normal, S2 normal, normal heart sounds, intact distal pulses and normal pulses.  Tachycardia present.  Exam reveals no gallop and no friction rub.   No murmur heard. Pulmonary/Chest: Effort normal and breath sounds normal. No respiratory distress. He has no wheezes. He has no rhonchi. He has no rales.  Abdominal: Soft. Normal appearance and bowel sounds are normal. He exhibits no distension, no ascites and no mass. There is no hepatosplenomegaly. There is no tenderness. There is no rebound, no guarding and no CVA tenderness.  Musculoskeletal: Normal range of motion. He exhibits no tenderness.  There is trace bilateral lower extremity edema.   Lymphadenopathy:    He has no cervical adenopathy.  Neurological: He is alert and oriented to person, place, and time. He has normal strength. No cranial nerve deficit or sensory deficit.  Skin: Skin is warm, dry and intact. No petechiae and no rash noted. He is not diaphoretic. No erythema. No pallor.  Nursing note and vitals reviewed.    ED Treatments / Results  DIAGNOSTIC STUDIES: Oxygen Saturation is 95 percent on room air which is normal by my interpretation.    COORDINATION OF CARE: 11:31 PM Discussed treatment plan with pt at bedside and pt agreed to plan.  Labs (all labs ordered are listed, but only abnormal results are displayed) Labs Reviewed  COMPREHENSIVE METABOLIC PANEL - Abnormal; Notable for the following:       Result Value   Potassium 3.4 (*)    Chloride 100 (*)    Glucose, Bld 185 (*)    All other components within normal limits   CBC WITH DIFFERENTIAL/PLATELET - Abnormal; Notable for the following:    Monocytes Absolute 1.3 (*)    All other components within normal limits  I-STAT CG4 LACTIC ACID, ED  I-STAT CG4 LACTIC ACID, ED    EKG  EKG Interpretation None       Radiology Dg Chest 2 View  Result Date: 05/11/2016 CLINICAL DATA:  Productive cough with fever and body ache EXAM: CHEST  2 VIEW COMPARISON:  08/10/2013, 08/01/2013 FINDINGS: Post sternotomy changes. No focal consolidation or pleural effusion is visualized. Normal cardiomediastinal silhouette with atherosclerosis. No pneumothorax. Degenerative changes of the spine. A round opacity overlying the mid thoracic spine is unchanged  compared with 2015. IMPRESSION: No active cardiopulmonary disease. Electronically Signed   By: Donavan Foil M.D.   On: 05/11/2016 19:13    Procedures Procedures (including critical care time)  Medications Ordered in ED Medications  acetaminophen (TYLENOL) 325 MG tablet (not administered)  sodium chloride 0.9 % bolus 1,000 mL (not administered)  oseltamivir (TAMIFLU) capsule 75 mg (not administered)  metoCLOPramide (REGLAN) tablet 10 mg (not administered)  ketorolac (TORADOL) 30 MG/ML injection 30 mg (not administered)  acetaminophen (TYLENOL) tablet 650 mg (650 mg Oral Given 05/11/16 1847)     Initial Impression / Assessment and Plan / ED Course  I have reviewed the triage vital signs and the nursing notes.  Pertinent labs & imaging results that were available during my care of the patient were reviewed by me and considered in my medical decision making (see chart for details).     Patient presents to the ED for flu like symptoms, and I believe he does indeed have the flu.  He was febrile and tachycardic initially.  After tylenol and IVF, abnormal VS have resolved.  He was given tamiflu in the ED as well given his age.  Will DC with rx to complete the course.  CXR negative for pneumonia. He appears well and in NAD.   PCP fu advised within 3 days with strict ED return precautions.  Patient is safe for DC.    Final Clinical Impressions(s) / ED Diagnoses   Final diagnoses:  None    New Prescriptions New Prescriptions   No medications on file     I personally performed the services described in this documentation, which was scribed in my presence. The recorded information has been reviewed and is accurate.      Everlene Balls, MD 05/12/16 [phone]

## 2016-05-11 NOTE — ED Triage Notes (Signed)
Pt reports having productive cough, fever, bodyaches, headache and nausea since Sunday. Febrile at triage.

## 2016-05-12 NOTE — ED Notes (Signed)
Patient verbalized understanding of discharge instructions and denies any further needs or questions at this time. VS stable. Patient ambulatory with steady gait.  

## 2016-06-07 ENCOUNTER — Other Ambulatory Visit: Payer: Self-pay | Admitting: Cardiology

## 2016-07-04 ENCOUNTER — Other Ambulatory Visit: Payer: Self-pay | Admitting: Cardiology

## 2016-07-05 NOTE — Telephone Encounter (Signed)
Rx(s) sent to pharmacy electronically.  

## 2017-04-12 ENCOUNTER — Other Ambulatory Visit (HOSPITAL_COMMUNITY): Payer: Self-pay | Admitting: MEDICAL ONCOLOGY

## 2017-04-12 DIAGNOSIS — C3490 Malignant neoplasm of unspecified part of unspecified bronchus or lung: Secondary | ICD-10-CM

## 2017-04-26 ENCOUNTER — Ambulatory Visit (HOSPITAL_BASED_OUTPATIENT_CLINIC_OR_DEPARTMENT_OTHER): Payer: Commercial Managed Care - PPO | Admitting: Surgery

## 2017-05-10 ENCOUNTER — Ambulatory Visit: Payer: Commercial Managed Care - PPO | Attending: MEDICAL ONCOLOGY | Admitting: Surgery

## 2017-05-10 ENCOUNTER — Encounter (HOSPITAL_BASED_OUTPATIENT_CLINIC_OR_DEPARTMENT_OTHER): Payer: Self-pay | Admitting: Surgery

## 2017-05-10 VITALS — BP 90/55 | HR 114 | Temp 98.4°F | Resp 18 | Ht 71.0 in | Wt 235.5 lb

## 2017-05-10 DIAGNOSIS — C3411 Malignant neoplasm of upper lobe, right bronchus or lung: Secondary | ICD-10-CM

## 2017-05-10 DIAGNOSIS — K7460 Unspecified cirrhosis of liver: Secondary | ICD-10-CM | POA: Insufficient documentation

## 2017-05-10 DIAGNOSIS — Z79899 Other long term (current) drug therapy: Secondary | ICD-10-CM | POA: Insufficient documentation

## 2017-05-10 DIAGNOSIS — E119 Type 2 diabetes mellitus without complications: Secondary | ICD-10-CM | POA: Insufficient documentation

## 2017-05-10 DIAGNOSIS — R188 Other ascites: Secondary | ICD-10-CM

## 2017-05-10 DIAGNOSIS — C3490 Malignant neoplasm of unspecified part of unspecified bronchus or lung: Secondary | ICD-10-CM

## 2017-05-10 DIAGNOSIS — J449 Chronic obstructive pulmonary disease, unspecified: Secondary | ICD-10-CM | POA: Insufficient documentation

## 2017-05-10 DIAGNOSIS — I10 Essential (primary) hypertension: Secondary | ICD-10-CM | POA: Insufficient documentation

## 2017-05-10 DIAGNOSIS — C3431 Malignant neoplasm of lower lobe, right bronchus or lung: Secondary | ICD-10-CM | POA: Insufficient documentation

## 2017-05-10 DIAGNOSIS — Z951 Presence of aortocoronary bypass graft: Secondary | ICD-10-CM | POA: Insufficient documentation

## 2017-05-10 DIAGNOSIS — Z01818 Encounter for other preprocedural examination: Secondary | ICD-10-CM

## 2017-05-10 DIAGNOSIS — I2510 Atherosclerotic heart disease of native coronary artery without angina pectoris: Secondary | ICD-10-CM | POA: Insufficient documentation

## 2017-05-11 ENCOUNTER — Encounter (HOSPITAL_BASED_OUTPATIENT_CLINIC_OR_DEPARTMENT_OTHER): Payer: Self-pay | Admitting: Surgery

## 2017-05-11 DIAGNOSIS — R188 Other ascites: Secondary | ICD-10-CM

## 2017-05-11 DIAGNOSIS — K7460 Unspecified cirrhosis of liver: Secondary | ICD-10-CM | POA: Insufficient documentation

## 2017-05-11 DIAGNOSIS — J449 Chronic obstructive pulmonary disease, unspecified: Secondary | ICD-10-CM | POA: Insufficient documentation

## 2017-05-11 DIAGNOSIS — I2510 Atherosclerotic heart disease of native coronary artery without angina pectoris: Secondary | ICD-10-CM

## 2017-05-11 HISTORY — DX: Chronic obstructive pulmonary disease, unspecified (CMS HCC): J44.9

## 2017-05-11 HISTORY — DX: Atherosclerotic heart disease of native coronary artery without angina pectoris: I25.10

## 2017-05-11 HISTORY — DX: Other ascites: R18.8

## 2017-05-11 HISTORY — DX: Unspecified cirrhosis of liver (CMS HCC): K74.60

## 2017-05-11 NOTE — H&P (Signed)
[facility]  Thoracic Surgery division      D.O.B.: 12-09-1945  MRN# [id number]  Date of Service: 05/10/2017    PCP: Linus Galas, MD    Chief Complaint:   Chief Complaint   Patient presents with   . Lung Mass       HPI  History obtained from: Patient  Stephen Sheppard is a 72 y.o. male who presents as a referral from Dr Josie Saunders for evaluation of a right lower lobe lung mass which is biopsy proven to be squamous cell carcinoma. He has multiple comorbidities including CAD with CABGx5, cirrhosis with ascites requiring weekly paracentesis and esophageal varices, DM, COPD, and a CVA. He was seeing Dr Josie Saunders for possible systemic therapy and wanted to know if surgical resection would be a viable option, so he was sent to [place] for evaluation. The mass was found on a CT scan after he presented to the ED after "just not feeling well." He states that he has had more fatigue recently, but otherwise denies any changes. He has no fevers, chills, night sweats, nausea, vomiting, diarrhea, or constipation. He affirms dyspnea which he says is relieved some by the paracenteses, but still persists afterwards to a certain extent.    Allergies: No Known Allergies   Current Outpatient Prescriptions   Outpatient Medications Marked as Taking for the 05/10/17 encounter (Office Visit) with Susette Racer, MD   Medication Sig   . atorvastatin (LIPITOR) 80 mg Oral Tablet TAKE 1 TABLET BY MOUTH EVERY DAY   . etodolac (LODINE) 400 mg Oral Tablet TAKE 1 TABLET BY MOUTH TWICE DAILY WITH FOOD   . fluconazole (DIFLUCAN) 150 mg Oral Tablet TAKE 1 TABLET NOW   . folic acid (FOLVITE) 1 mg Oral Tablet TAKE 1 TABLET BY MOUTH EVERY DAY   . furosemide (LASIX) 20 mg Oral Tablet TAKE 1 TABLET BY MOUTH TWICE DAILY   . ipratropium-albuterol 0.5 mg-3 mg(2.5 mg base)/3 mL Solution for Nebulization inhale ONE vial via nebulizer SIX TIMES A DAY AS NEEDED   . levoFLOXacin (LEVAQUIN) 500 mg Oral Tablet TAKE 1 TABLET BY MOUTH EVERY DAY   .  methotrexate 2.5 mg tablet TAKE 4 TABLETS BY MOUTH ONCE WEEKLY   . metoprolol succinate (TOPROL-XL) 25 mg Oral Tablet Sustained Release 24 hr TAKE ONE-HALF TABLET BY MOUTH ONCE daily   . predniSONE (DELTASONE) 20 mg Oral Tablet TAKE THREE TABLETS BY MOUTH EVERY DAY FOR THREE DAYS THEN DECREASE BY1/2 TABLET EVERY THREE DAYS   . spironolactone (ALDACTONE) 25 mg Oral Tablet TAKE 1 TABLET BY MOUTH EVERY DAY   . sulfaSALAzine (AZULFIDINE EN-TAB) 500 mg Oral Tablet, Delayed Release (E.C.) TAKE 2 TABLETS BY MOUTH 3 TIMES DAILY   . tamsulosin (FLOMAX) 0.4 mg Oral Capsule TAKE 1 TABLET BY MOUTH TWICE DAILY  INCREASED DOSE     Past Medical History:   Past Medical History:   Diagnosis Date   . Ascites 05/11/2017   . CAD (coronary artery disease) 05/11/2017   . Cirrhosis ([facility] [place]) 05/11/2017   . COPD (chronic obstructive pulmonary disease) ([facility]) 05/11/2017       Past Surgical History:   Past Surgical History:   Procedure Laterality Date   . HX CORONARY ARTERY BYPASS GRAFT      x5   . LUNG BIOPSY Right          Family History:  Family Medical History:     None  Social History:   Social History     Tobacco Use   . Smoking status: Never Smoker   . Smokeless tobacco: Never Used   Substance Use Topics   . Alcohol use: Not Currently        ROS:  General: Denies fever, chills, or any changes in weight.  EENT: Denies blurry vision, tinnitus, nasal congestion, or trouble with swallowing.  Cardiovascular: Denies chest pain, pain on exertion, or palpitations.  Respiratory: Denies SOB, cough, or asthma/COPD/emphysema.  Gastrointestinal: Denies abdominal pain, N/V, constipation, or diarrhea. Denies any change in bowel movements or blood in stool. Affirms ascites and cirrhosis  GU: Denies dysuria, change in urinary habits, or hematuria.  Musculoskeletal: Denies weakness or joint pain. Denies trouble moving extremities.  Neurological: Denies HA, dizziness, Affirms hx of stroke.  Hematologic: Denies hx of bleeding disorders or blood  clots.  Endocrine: Denies heat/cold intolerance, flushing, or change in glove/shoe size.  Psychiatric: Denies changes in mood, anxiety, or depression.    Objective:  Vitals: BP (!) 90/55   Pulse (!) 114   Temp 36.9 C (98.4 F)   Resp 18   Ht 1.803 m (5\' 11" )   Wt 106.8 kg (235 lb 7.2 oz)   BMI 32.84 kg/m         Risk Assessment:  Family history of anesthesia complications?  No.  Previous Cardiothoracic Surgery? Yes, and the Surgery was: CABGx5.  Cardiac History: Coronary artery disease.  Cerebrovascular Disease? No.   Pulmonary hypertension? No.  Unintended Weight loss within the last 3 months has been stable.  Previous or current Chemotherapy? No Cancer.  Previous or current Radiation therapy? No Location.  FEV1 ordered?No. If No, Reason: not indicated  Functional Status:Partially Dependent.  ECOG Score: (3) Capable of limited self-care, confined to bed or chair > 50% of waking hours  .    Physical Exam:  Constitutional: Very pleasant, AAOx3, NAD  EYES: PERRLA  Neurological: Answers questions appropriately.  HEENT: Mucous membranes intact.   Neck: Trachea midline, supple  Cardiovascular: RRR; No murmurs, rubs, or gallops present. S1, S2 normal. No peripheral edema; No cyanosis or clubbing of nails.  Pulmonary: Lungs CTA bilaterally, decreased in right base; No wheezes, rales, or rhonchi observed. Normal respiratory effort. No retractions.  Abdomen: Abdomen soft; No tenderness. Distended with ascites; No masses or HSM present.   Musculoskeletal: Normal muscle strength and tone of all four extremities. Normocephalic atraumatic.  Skin: No rashes or lesions present; Warm and dry. No jaundice.  Psychiatric: Normal mood and affect; Judgment and thought content normal.    Data:  Pathology: T4NX squamous cell carcinoma    Labs:  Reviewed:  I have reviewed all lab results.  Additional Labs Ordered:   No             Diagnostic Tests: PET:  direct visualization of the images shows hypermetabolic activity in the large  right lower lobe lung mass  Additional Diagnostic Test Ordered:  No                                                      Radiology Tests: CT:   Direct visualization of the image on 05/10/17 on Image grid showed:  Large right lower lobe cavitary lung mass consistent with the diagnosis of squamous cell carcinoma. He also has some enlarge mediastinal  lymph nodes    Additional Radiology ordered:  No             Assessment:  1. Right lower lobe squamous cell carcinoma with mediastinal adenopathy  2. Multiple comorbidities including cirrhosis with ascites, CVA, DM, CABGx5, and COPD    Plan:  1. Offered EBUS for pathologic staging, patient refused  2. Follow up with Dr Josie Saunders for systemic therapy. If he has a good response to treatment and his cirrhosis can be managed, we would reconsider surgical resection at that time.     He was given the opportunity to ask questions, and those questions were appropriately answered. He agreed with the treatment plan and is encouraged to call with any additional questions or concerns.         Janit Pagan, PA-C     Patient seen and examined with APP  I had the pleasure of seeing Dub Maclellan in [place].  He is a 72 yrs old male with multiple comorbidity , liver cirrhosis requiring weekly paracentesis, presents with a necrotic RLL biopsy proven squamous cell cancer. He does have borderline mediastinal adenopathy but no uptake on PET. The lesion is PET positive. Due to his generalized weakness, liver cirrhosis with ascitis requiring paracentesis he is a very high risk candidate for surgery. Recommend EBUS to rule out mediastinal involvement and then chemo and XRT to the RLL or even SBRT.  I will call you to discuss this case further with you.     Thank you again for allowing me to participate in [facility] care.  Should you have any questions about patient's condition or any other thoracic or esophageal issue that may arise please do not hesitate to call  me on my cell [phone]    Sincerely,    Susette Racer MD [place], Division of Thoracic Surgery  Surgical Director, Thoracic Oncology [facility]  Associate Professor, Department of Cardiovascular & Thoracic Surgery  [facility]  [address], [place], [place] [id number]  [email or web address] [phone]

## 2017-05-18 ENCOUNTER — Encounter (HOSPITAL_COMMUNITY): Admission: RE | Payer: Self-pay | Source: Ambulatory Visit

## 2017-05-18 ENCOUNTER — Encounter (HOSPITAL_COMMUNITY): Payer: Self-pay

## 2017-05-18 ENCOUNTER — Ambulatory Visit (HOSPITAL_COMMUNITY)
Admission: RE | Admit: 2017-05-18 | Payer: Commercial Managed Care - PPO | Source: Ambulatory Visit | Admitting: Transplant Surgery

## 2017-05-18 SURGERY — ULTRASOUND ENDOBRONCHIAL
Anesthesia: General | Site: Head

## 2017-05-19 ENCOUNTER — Encounter (HOSPITAL_COMMUNITY): Payer: Self-pay

## 2017-06-20 DEATH — deceased

## 2018-06-26 ENCOUNTER — Other Ambulatory Visit: Payer: Self-pay

## 2018-06-26 ENCOUNTER — Encounter (HOSPITAL_COMMUNITY): Payer: Self-pay | Admitting: Emergency Medicine

## 2018-06-26 ENCOUNTER — Emergency Department (HOSPITAL_COMMUNITY)
Admission: EM | Admit: 2018-06-26 | Discharge: 2018-06-26 | Disposition: A | Discharge status: Home / Self Care | Payer: Medicare Other | Attending: Emergency Medicine | Admitting: Emergency Medicine

## 2018-06-26 ENCOUNTER — Emergency Department (HOSPITAL_COMMUNITY): Payer: Medicare Other

## 2018-06-26 DIAGNOSIS — E119 Type 2 diabetes mellitus without complications: Secondary | ICD-10-CM | POA: Insufficient documentation

## 2018-06-26 DIAGNOSIS — I10 Essential (primary) hypertension: Secondary | ICD-10-CM | POA: Insufficient documentation

## 2018-06-26 DIAGNOSIS — Y92007 Garden or yard of unspecified non-institutional (private) residence as the place of occurrence of the external cause: Secondary | ICD-10-CM | POA: Diagnosis not present

## 2018-06-26 DIAGNOSIS — E785 Hyperlipidemia, unspecified: Secondary | ICD-10-CM | POA: Insufficient documentation

## 2018-06-26 DIAGNOSIS — S161XXA Strain of muscle, fascia and tendon at neck level, initial encounter: Secondary | ICD-10-CM | POA: Diagnosis not present

## 2018-06-26 DIAGNOSIS — S0990XA Unspecified injury of head, initial encounter: Secondary | ICD-10-CM | POA: Diagnosis present

## 2018-06-26 DIAGNOSIS — Z7982 Long term (current) use of aspirin: Secondary | ICD-10-CM | POA: Diagnosis not present

## 2018-06-26 DIAGNOSIS — I252 Old myocardial infarction: Secondary | ICD-10-CM | POA: Diagnosis not present

## 2018-06-26 DIAGNOSIS — Z79899 Other long term (current) drug therapy: Secondary | ICD-10-CM | POA: Insufficient documentation

## 2018-06-26 DIAGNOSIS — Z7984 Long term (current) use of oral hypoglycemic drugs: Secondary | ICD-10-CM | POA: Insufficient documentation

## 2018-06-26 DIAGNOSIS — Y93H2 Activity, gardening and landscaping: Secondary | ICD-10-CM | POA: Insufficient documentation

## 2018-06-26 DIAGNOSIS — Y990 Civilian activity done for income or pay: Secondary | ICD-10-CM | POA: Insufficient documentation

## 2018-06-26 DIAGNOSIS — S39012A Strain of muscle, fascia and tendon of lower back, initial encounter: Secondary | ICD-10-CM | POA: Diagnosis not present

## 2018-06-26 DIAGNOSIS — I2510 Atherosclerotic heart disease of native coronary artery without angina pectoris: Secondary | ICD-10-CM | POA: Diagnosis not present

## 2018-06-26 MED ORDER — FENTANYL CITRATE (PF) 100 MCG/2ML IJ SOLN
25.0000 ug | Freq: Once | INTRAMUSCULAR | Status: AC
  Administered 2018-06-26: 25 ug via INTRAVENOUS
  Filled 2018-06-26: qty 2

## 2018-06-26 MED ORDER — METHOCARBAMOL 500 MG PO TABS: 500.0000 mg | ORAL_TABLET | Freq: Two times a day (BID) | ORAL | 0 refills | Status: DC

## 2018-06-26 NOTE — ED Notes (Signed)
Wasted 75 mcg of fentanyl with Gwenith Daily RN

## 2018-06-26 NOTE — ED Notes (Signed)
Patient verbalizes understanding of discharge instructions. Opportunity for questioning and answers were provided. Armband removed by staff, pt discharged from ED via wheelchair to home.  
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
## 2018-06-26 NOTE — ED Notes (Signed)
This RN witnessed Retail banker of 13mcg of Fentanyl

## 2018-06-26 NOTE — Discharge Instructions (Signed)
Follow-up with your primary care provider. Use a heating pad and stretch the sore areas of your back to help with symptoms. Return to the ED if you start to have worsening symptoms, severe headache, blurry vision, numbness in arms or legs, additional injuries or falls.
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
## 2018-10-10 ENCOUNTER — Encounter: Payer: Self-pay | Admitting: Cardiology

## 2018-10-10 ENCOUNTER — Telehealth (INDEPENDENT_AMBULATORY_CARE_PROVIDER_SITE_OTHER): Payer: Medicare Other | Admitting: Cardiology

## 2018-10-10 VITALS — BP 137/85 | HR 79 | Ht 69.5 in | Wt 194.0 lb

## 2018-10-10 DIAGNOSIS — E118 Type 2 diabetes mellitus with unspecified complications: Secondary | ICD-10-CM | POA: Diagnosis not present

## 2018-10-10 DIAGNOSIS — I10 Essential (primary) hypertension: Secondary | ICD-10-CM | POA: Diagnosis not present

## 2018-10-10 DIAGNOSIS — Z7189 Other specified counseling: Secondary | ICD-10-CM

## 2018-10-10 DIAGNOSIS — I2510 Atherosclerotic heart disease of native coronary artery without angina pectoris: Secondary | ICD-10-CM | POA: Diagnosis not present

## 2018-10-10 MED ORDER — NITROGLYCERIN 0.4 MG SL SUBL: 0.4000 mg | SUBLINGUAL_TABLET | SUBLINGUAL | 6 refills | Status: DC | PRN

## 2018-10-10 MED ORDER — AMLODIPINE BESYLATE 2.5 MG PO TABS: 2.5000 mg | ORAL_TABLET | Freq: Every day | ORAL | 3 refills | Status: DC

## 2018-10-10 NOTE — Patient Instructions (Signed)
Follow-Up: You will need a follow up appointment in 12 months.  Please call our office 2 months in advance, MAY 2021 to schedule this, July 2021 appointment.  You may see Minus Breeding, MD or one of the following Advanced Practice Providers on your designated Care Team:  Rosaria Ferries, PA-C  Jory Sims, DNP, ANP       Medication Instructions:  The current medical regimen is effective;  continue present plan and medications as directed. Please refer to the Current Medication list given to you today. If you need a refill on your cardiac medications before your next appointment, please call your pharmacy. Labwork: When you have labs (blood work) and your tests are completely normal, you will receive your results ONLY by [place] (if you have MyChart) -OR- A paper copy in the mail.  At [facility], you and your health needs are our priority.  As part of our continuing mission to provide you with exceptional heart care, we have created designated Provider Care Teams.  These Care Teams include your primary Cardiologist (physician) and Advanced Practice Providers (APPs -  Physician Assistants and Nurse Practitioners) who all work together to provide you with the care you need, when you need it.  Thank you for choosing [facility] at [facility]!!

## 2019-01-23 ENCOUNTER — Other Ambulatory Visit: Payer: Self-pay

## 2019-01-23 DIAGNOSIS — Z20822 Contact with and (suspected) exposure to covid-19: Secondary | ICD-10-CM

## 2019-01-24 LAB — NOVEL CORONAVIRUS, NAA: SARS-CoV-2, NAA: NOT DETECTED

## 2019-08-03 ENCOUNTER — Telehealth: Payer: Self-pay | Admitting: Cardiology

## 2019-08-03 NOTE — Telephone Encounter (Signed)
Called patient 08/03/19 to get scheduled and the number has been disconnected

## 2019-08-27 NOTE — Progress Notes (Signed)
Cardiology Office Note   Date:  08/28/2019   ID:  Mathew Clements, DOB 1945/06/08, MRN [id number]  PCP:  Caren Macadam, MD  Cardiologist:   Minus Breeding, MD   Chief Complaint  Patient presents with   Coronary Artery Disease      History of Present Illness: Mathew Clements is a 74 y.o. male who presents for follow up after CABG.  Since I last saw him he has done well.  He is working part time.  He pushed a mower recently.  The patient denies any new symptoms such as chest discomfort, neck or arm discomfort. There has been no new shortness of breath, PND or orthopnea. There have been no reported palpitations, presyncope or syncope.    Past Medical History:  Diagnosis Date   Anemia    Arthritis    CAD (coronary artery disease)    with an ejection fraction of 65%   Cancer (HCC)    skin cancer right ear   Diabetes mellitus    H/O: Bell's palsy    Hemorrhoid    HTN (hypertension)    Hyperlipemia    Kidney stone    Myocardial infarction [facility])     Past Surgical History:  Procedure Laterality Date   BACK SURGERY     CARDIAC CATHETERIZATION  10/15/2010   Dr [place] Spanish   CORONARY ARTERY BYPASS GRAFT  10/20/2010   x 3, Dr Servando Snare   TOTAL KNEE ARTHROPLASTY Left 05/09/2012   Procedure: TOTAL KNEE ARTHROPLASTY;  Surgeon: Hessie Dibble, MD;  Location: [place];  Service: Orthopedics;  Laterality: Left;     Current Outpatient Medications  Medication Sig Dispense Refill   amLODipine (NORVASC) 10 MG tablet Take 10 mg by mouth daily.     aspirin 81 MG EC tablet Take 81 mg by mouth daily. Swallow whole.     atorvastatin (LIPITOR) 40 MG tablet Take 40 mg by mouth daily.     glipiZIDE (GLUCOTROL XL) 10 MG 24 hr tablet Take 10 mg by mouth daily with breakfast.     losartan (COZAAR) 100 MG tablet Take 100 mg by mouth daily.     metFORMIN (GLUCOPHAGE) 1000 MG tablet Take 1,000 mg by mouth 2 (two) times daily with a meal.     metoprolol tartrate (LOPRESSOR) 25 MG  tablet Take 25 mg by mouth 2 (two) times daily.     nitroGLYCERIN (NITROSTAT) 0.4 MG SL tablet Place 1 tablet (0.4 mg total) under the tongue every 5 (five) minutes as needed for chest pain. 25 tablet 6   No current facility-administered medications for this visit.    Allergies:   Patient has no known allergies.    ROS:  Please see the history of present illness.   Otherwise, review of systems are positive for none.   All other systems are reviewed and negative.    PHYSICAL EXAM: VS:  BP (!) 146/68    Ht 5\' 10"  (1.778 m)    Wt 202 lb (91.6 kg)    BMI 28.98 kg/m  , BMI Body mass index is 28.98 kg/m. GENERAL:  Well appearing NECK:  No jugular venous distention, waveform within normal limits, carotid upstroke brisk and symmetric, no bruits, no thyromegaly LUNGS:  Clear to auscultation bilaterally CHEST:  Well healed sternotomy scar. HEART:  PMI not displaced or sustained,S1 and S2 within normal limits, no S3, no S4, no clicks, no rubs, soft brief apical systolic murmur nonradiating, no diastolic murmurs ABD:  Flat, positive bowel  sounds normal in frequency in pitch, no bruits, no rebound, no guarding, no midline pulsatile mass, no hepatomegaly, no splenomegaly EXT:  2 plus pulses throughout, no edema, no cyanosis no clubbing   EKG:  EKG is ordered today. The ekg ordered today demonstrates sinus rhythm, rate 67, right axis deviation, low voltage on the left leads, poor anterior R wave progression, no acute ST-T wave changes.   Recent Labs: No results found for requested labs within last 8760 hours.    Lipid Panel    Component Value Date/Time   CHOL 88 (L) 04/14/2015 0846   TRIG 84 04/14/2015 0846   HDL 35 (L) 04/14/2015 0846   CHOLHDL 2.5 04/14/2015 0846   VLDL 17 04/14/2015 0846   LDLCALC 36 04/14/2015 0846      Wt Readings from Last 3 Encounters:  08/28/19 202 lb (91.6 kg)  10/10/18 194 lb (88 kg)  06/26/18 198 lb (89.8 kg)      Other studies Reviewed: Additional  studies/ records that were reviewed today include: Labs. Review of the above records demonstrates:  Please see elsewhere in the note.     ASSESSMENT AND PLAN:  CAD (coronary artery disease) -   The patient has no new sypmtoms.  No further cardiovascular testing is indicated.  We will continue with aggressive risk reduction and meds as listed.  HTN (hypertension) -  The blood pressure is very slightly elevated but his primary doctor has been working on that with appropriate goals of 130/80.   Hyperlipidemia -   LDL was 48 with an HDL of 40.  No change in therapy.  DM -     A1c was 7.3 which was down from 12.1.  His doctor is making strides on this.  I might suggest a SGLT2 inhibitor or GLP-1 receptor antagonist.  However, I will defer to the excellent care of his primary provider.   COVID EDUCATION: He is not interested in the vaccine though we talked about this today.   Current medicines are reviewed at length with the patient today.  The patient does not have concerns regarding medicines.  The following changes have been made:  no change Cad  Labs/ tests ordered today include: None  Orders Placed This Encounter  Procedures   EKG 12-Lead     Disposition:   FU with me in 24 months.     Signed, Minus Breeding, MD  08/28/2019 8:17 AM    [facility]

## 2019-08-28 ENCOUNTER — Other Ambulatory Visit: Payer: Self-pay

## 2019-08-28 ENCOUNTER — Encounter: Payer: Self-pay | Admitting: Cardiology

## 2019-08-28 ENCOUNTER — Ambulatory Visit: Payer: Medicare Other | Admitting: Cardiology

## 2019-08-28 VITALS — BP 146/68 | Ht 70.0 in | Wt 202.0 lb

## 2019-08-28 DIAGNOSIS — I2510 Atherosclerotic heart disease of native coronary artery without angina pectoris: Secondary | ICD-10-CM | POA: Diagnosis not present

## 2019-08-28 DIAGNOSIS — E118 Type 2 diabetes mellitus with unspecified complications: Secondary | ICD-10-CM

## 2019-08-28 DIAGNOSIS — E785 Hyperlipidemia, unspecified: Secondary | ICD-10-CM

## 2019-08-28 DIAGNOSIS — I10 Essential (primary) hypertension: Secondary | ICD-10-CM | POA: Diagnosis not present

## 2019-08-28 DIAGNOSIS — Z7189 Other specified counseling: Secondary | ICD-10-CM

## 2019-08-28 MED ORDER — NITROGLYCERIN 0.4 MG SL SUBL: 0.4000 mg | SUBLINGUAL_TABLET | SUBLINGUAL | 6 refills | Status: DC | PRN

## 2019-08-28 NOTE — Patient Instructions (Signed)
Medication Instructions:  NITRO REFILLED *If you need a refill on your cardiac medications before your next appointment, please call your pharmacy*  Lab Work: NONE ORDERED THIS VISIT If you have labs (blood work) drawn today and your tests are completely normal, you will receive your results only by: Marland Kitchen MyChart Message (if you have MyChart) OR . A paper copy in the mail If you have any lab test that is abnormal or we need to change your treatment, we will call you to review the results.  Testing/Procedures: NONE ORDERED THIS VISIT  Follow-Up: At [facility], you and your health needs are our priority.  As part of our continuing mission to provide you with exceptional heart care, we have created designated Provider Care Teams.  These Care Teams include your primary Cardiologist (physician) and Advanced Practice Providers (APPs -  Physician Assistants and Nurse Practitioners) who all work together to provide you with the care you need, when you need it.  Your next appointment:   24 month(s)  You will receive a reminder letter in the mail two months in advance. If you don't receive a letter, please call our office to schedule the follow-up appointment.   The format for your next appointment:   In Person  Provider:   Minus Breeding, MD

## 2019-11-08 ENCOUNTER — Emergency Department (HOSPITAL_COMMUNITY): Payer: Medicare Other

## 2019-11-08 ENCOUNTER — Encounter (HOSPITAL_COMMUNITY): Payer: Self-pay | Admitting: Emergency Medicine

## 2019-11-08 ENCOUNTER — Inpatient Hospital Stay (HOSPITAL_COMMUNITY)
Admission: EM | Admit: 2019-11-08 | Discharge: 2019-11-13 | DRG: 177 | Disposition: A | Discharge status: Home / Self Care | Payer: Medicare Other | Attending: Internal Medicine | Admitting: Internal Medicine

## 2019-11-08 DIAGNOSIS — Z96652 Presence of left artificial knee joint: Secondary | ICD-10-CM | POA: Diagnosis present

## 2019-11-08 DIAGNOSIS — A419 Sepsis, unspecified organism: Secondary | ICD-10-CM

## 2019-11-08 DIAGNOSIS — R079 Chest pain, unspecified: Secondary | ICD-10-CM

## 2019-11-08 DIAGNOSIS — Y92239 Unspecified place in hospital as the place of occurrence of the external cause: Secondary | ICD-10-CM | POA: Diagnosis not present

## 2019-11-08 DIAGNOSIS — Z7984 Long term (current) use of oral hypoglycemic drugs: Secondary | ICD-10-CM

## 2019-11-08 DIAGNOSIS — Z951 Presence of aortocoronary bypass graft: Secondary | ICD-10-CM

## 2019-11-08 DIAGNOSIS — J9601 Acute respiratory failure with hypoxia: Secondary | ICD-10-CM | POA: Diagnosis present

## 2019-11-08 DIAGNOSIS — Z85828 Personal history of other malignant neoplasm of skin: Secondary | ICD-10-CM

## 2019-11-08 DIAGNOSIS — Z79899 Other long term (current) drug therapy: Secondary | ICD-10-CM

## 2019-11-08 DIAGNOSIS — E872 Acidosis: Secondary | ICD-10-CM | POA: Diagnosis present

## 2019-11-08 DIAGNOSIS — U071 COVID-19: Secondary | ICD-10-CM

## 2019-11-08 DIAGNOSIS — I445 Left posterior fascicular block: Secondary | ICD-10-CM | POA: Diagnosis present

## 2019-11-08 DIAGNOSIS — J1282 Pneumonia due to coronavirus disease 2019: Secondary | ICD-10-CM | POA: Diagnosis present

## 2019-11-08 DIAGNOSIS — E785 Hyperlipidemia, unspecified: Secondary | ICD-10-CM | POA: Diagnosis present

## 2019-11-08 DIAGNOSIS — R9431 Abnormal electrocardiogram [ECG] [EKG]: Secondary | ICD-10-CM | POA: Diagnosis not present

## 2019-11-08 DIAGNOSIS — Z833 Family history of diabetes mellitus: Secondary | ICD-10-CM

## 2019-11-08 DIAGNOSIS — Z8249 Family history of ischemic heart disease and other diseases of the circulatory system: Secondary | ICD-10-CM

## 2019-11-08 DIAGNOSIS — E1165 Type 2 diabetes mellitus with hyperglycemia: Secondary | ICD-10-CM | POA: Diagnosis not present

## 2019-11-08 DIAGNOSIS — Z823 Family history of stroke: Secondary | ICD-10-CM

## 2019-11-08 DIAGNOSIS — Z7982 Long term (current) use of aspirin: Secondary | ICD-10-CM

## 2019-11-08 DIAGNOSIS — I2510 Atherosclerotic heart disease of native coronary artery without angina pectoris: Secondary | ICD-10-CM | POA: Diagnosis present

## 2019-11-08 DIAGNOSIS — Z87442 Personal history of urinary calculi: Secondary | ICD-10-CM

## 2019-11-08 DIAGNOSIS — T380X5A Adverse effect of glucocorticoids and synthetic analogues, initial encounter: Secondary | ICD-10-CM | POA: Diagnosis not present

## 2019-11-08 DIAGNOSIS — I252 Old myocardial infarction: Secondary | ICD-10-CM

## 2019-11-08 DIAGNOSIS — N179 Acute kidney failure, unspecified: Secondary | ICD-10-CM | POA: Diagnosis present

## 2019-11-08 DIAGNOSIS — A084 Viral intestinal infection, unspecified: Secondary | ICD-10-CM | POA: Diagnosis present

## 2019-11-08 DIAGNOSIS — I10 Essential (primary) hypertension: Secondary | ICD-10-CM | POA: Diagnosis present

## 2019-11-08 LAB — I-STAT CHEM 8, ED
BUN: 30 mg/dL — ABNORMAL HIGH (ref 8–23)
Calcium, Ion: 1.17 mmol/L (ref 1.15–1.40)
Chloride: 110 mmol/L (ref 98–111)
Creatinine, Ser: 1.5 mg/dL — ABNORMAL HIGH (ref 0.61–1.24)
Glucose, Bld: 235 mg/dL — ABNORMAL HIGH (ref 70–99)
HCT: 39 % (ref 39.0–52.0)
Hemoglobin: 13.3 g/dL (ref 13.0–17.0)
Potassium: 4.6 mmol/L (ref 3.5–5.1)
Sodium: 141 mmol/L (ref 135–145)
TCO2: 20 mmol/L — ABNORMAL LOW (ref 22–32)

## 2019-11-08 LAB — CBC WITH DIFFERENTIAL/PLATELET
Abs Immature Granulocytes: 0.02 10*3/uL (ref 0.00–0.07)
Basophils Absolute: 0 10*3/uL (ref 0.0–0.1)
Basophils Relative: 0 %
Eosinophils Absolute: 0 10*3/uL (ref 0.0–0.5)
Eosinophils Relative: 0 %
HCT: 39.2 % (ref 39.0–52.0)
Hemoglobin: 12.9 g/dL — ABNORMAL LOW (ref 13.0–17.0)
Immature Granulocytes: 0 %
Lymphocytes Relative: 20 %
Lymphs Abs: 1.4 10*3/uL (ref 0.7–4.0)
MCH: 29.5 pg (ref 26.0–34.0)
MCHC: 32.9 g/dL (ref 30.0–36.0)
MCV: 89.5 fL (ref 80.0–100.0)
Monocytes Absolute: 0.6 10*3/uL (ref 0.1–1.0)
Monocytes Relative: 9 %
Neutro Abs: 4.7 10*3/uL (ref 1.7–7.7)
Neutrophils Relative %: 71 %
Platelets: 173 10*3/uL (ref 150–400)
RBC: 4.38 MIL/uL (ref 4.22–5.81)
RDW: 12.6 % (ref 11.5–15.5)
WBC: 6.6 10*3/uL (ref 4.0–10.5)
nRBC: 0 % (ref 0.0–0.2)

## 2019-11-08 LAB — LIPID PANEL
Cholesterol: 79 mg/dL (ref 0–200)
HDL: 29 mg/dL — ABNORMAL LOW (ref >40)
LDL Cholesterol: 26 mg/dL (ref 0–99)
Total CHOL/HDL Ratio: 2.7 RATIO
Triglycerides: 122 mg/dL (ref <150)
VLDL: 24 mg/dL (ref 0–40)

## 2019-11-08 LAB — PROTIME-INR
INR: 1.1 (ref 0.8–1.2)
Prothrombin Time: 13.3 seconds (ref 11.4–15.2)

## 2019-11-08 LAB — COMPREHENSIVE METABOLIC PANEL
ALT: 43 U/L (ref 0–44)
AST: 49 U/L — ABNORMAL HIGH (ref 15–41)
Albumin: 3.4 g/dL — ABNORMAL LOW (ref 3.5–5.0)
Alkaline Phosphatase: 59 U/L (ref 38–126)
Anion gap: 9 (ref 5–15)
BUN: 26 mg/dL — ABNORMAL HIGH (ref 8–23)
CO2: 19 mmol/L — ABNORMAL LOW (ref 22–32)
Calcium: 8.9 mg/dL (ref 8.9–10.3)
Chloride: 109 mmol/L (ref 98–111)
Creatinine, Ser: 1.59 mg/dL — ABNORMAL HIGH (ref 0.61–1.24)
GFR calc Af Amer: 50 mL/min — ABNORMAL LOW (ref >60)
GFR calc non Af Amer: 43 mL/min — ABNORMAL LOW (ref >60)
Glucose, Bld: 230 mg/dL — ABNORMAL HIGH (ref 70–99)
Potassium: 4.4 mmol/L (ref 3.5–5.1)
Sodium: 137 mmol/L (ref 135–145)
Total Bilirubin: 0.7 mg/dL (ref 0.3–1.2)
Total Protein: 7 g/dL (ref 6.5–8.1)

## 2019-11-08 LAB — HEMOGLOBIN A1C
Hgb A1c MFr Bld: 7.3 % — ABNORMAL HIGH (ref 4.8–5.6)
Mean Plasma Glucose: 162.81 mg/dL

## 2019-11-08 LAB — D-DIMER, QUANTITATIVE: D-Dimer, Quant: 1.51 ug/mL-FEU — ABNORMAL HIGH (ref 0.00–0.50)

## 2019-11-08 LAB — APTT: aPTT: 31 seconds (ref 24–36)

## 2019-11-08 LAB — FIBRINOGEN: Fibrinogen: 654 mg/dL — ABNORMAL HIGH (ref 210–475)

## 2019-11-08 LAB — LACTATE DEHYDROGENASE: LDH: 296 U/L — ABNORMAL HIGH (ref 98–192)

## 2019-11-08 LAB — TRIGLYCERIDES: Triglycerides: 128 mg/dL (ref <150)

## 2019-11-08 LAB — TROPONIN I (HIGH SENSITIVITY): Troponin I (High Sensitivity): 13 ng/L (ref <18)

## 2019-11-08 MED ORDER — ASPIRIN 81 MG PO CHEW
324.0000 mg | CHEWABLE_TABLET | Freq: Once | ORAL | Status: AC
  Administered 2019-11-08: 324 mg via ORAL
  Filled 2019-11-08: qty 4

## 2019-11-08 MED ORDER — HEPARIN SODIUM (PORCINE) 5000 UNIT/ML IJ SOLN
4000.0000 [IU] | Freq: Once | INTRAMUSCULAR | Status: AC
  Administered 2019-11-08: 4000 [IU] via INTRAVENOUS
  Filled 2019-11-08: qty 1

## 2019-11-08 MED ORDER — SODIUM CHLORIDE 0.9 % IV SOLN: INTRAVENOUS | Status: DC

## 2019-11-08 NOTE — Consult Note (Signed)
Cardiology Consultation:   Patient ID: NIJEE HEATWOLE MRN: [id number]; DOB: 05-12-1945  Admit date: 11/08/2019 Date of Consult: 11/08/2019  Primary Care Provider: Caren Macadam, MD [facility] Cardiologist: Minus Breeding, MD   [facility] Electrophysiologist:  None     Patient Profile:   Mathew Clements is a 74 y.o. male with a hx of CAD s/p CABG 2014, HTN, and HLD who is being seen today for the evaluation of shortness of breath and abnormal ECG at the request of ER.  History of Present Illness:   [name] was in his usual state of health until Saturday when he began to feel short of breath with some associated chest tightness. He also endorsed having some body aches. He and his wife had a COVID test which was positive. He has not been vaccinated for COVID. He initially felt ok but started to have more significant shortness of breath associated with chest tightness, causing him to come to the ER for evaluation. He was started on 5L O2 with significant improvement in his breathing. He has very mild chest tightness which is much improved with oxygen therapy. He states that a week ago, he was able to be quite active including mowing his lawn without any shortness of breath or chest tightness. He denies any lower leg swelling, orthopnea, PND, palpitations. He has had lightheadedness and dizziness since diagnosed with COVID. He has been compliant with his cardiac medications throughout.    Past Medical History:  Diagnosis Date  . Anemia   . Arthritis   . CAD (coronary artery disease)    with an ejection fraction of 65%  . Cancer ([place])    skin cancer right ear  . Diabetes mellitus   . H/O: Bell's palsy   . Hemorrhoid   . HTN (hypertension)   . Hyperlipemia   . Kidney stone   . Myocardial infarction [facility])     Past Surgical History:  Procedure Laterality Date  . BACK SURGERY    . CARDIAC CATHETERIZATION  10/15/2010   Dr [facility]erc[place] Spanish  . CORONARY ARTERY BYPASS GRAFT  10/20/2010    x 3, Dr Servando Snare  . TOTAL KNEE ARTHROPLASTY Left 05/09/2012   Procedure: TOTAL KNEE ARTHROPLASTY;  Surgeon: Hessie Dibble, MD;  Location: [place];  Service: Orthopedics;  Laterality: Left;     Home Medications:  Prior to Admission medications   Medication Sig Start Date End Date Taking? Authorizing Provider  amLODipine (NORVASC) 10 MG tablet Take 10 mg by mouth daily.    [provider]  aspirin 81 MG EC tablet Take 81 mg by mouth daily. Swallow whole.    [provider]  atorvastatin (LIPITOR) 40 MG tablet Take 40 mg by mouth daily.    [provider]  glipiZIDE (GLUCOTROL XL) 10 MG 24 hr tablet Take 10 mg by mouth daily with breakfast.    [provider]  losartan (COZAAR) 100 MG tablet Take 100 mg by mouth daily.    [provider]  metFORMIN (GLUCOPHAGE) 1000 MG tablet Take 1,000 mg by mouth 2 (two) times daily with a meal.    [provider]  metoprolol tartrate (LOPRESSOR) 25 MG tablet Take 25 mg by mouth 2 (two) times daily.    [provider]  nitroGLYCERIN (NITROSTAT) 0.4 MG SL tablet Place 1 tablet (0.4 mg total) under the tongue every 5 (five) minutes as needed for chest pain. 08/28/19 11/26/19  Minus Breeding, MD    Inpatient Medications: Scheduled Meds:  Continuous Infusions: . sodium chloride 20 mL/hr at 11/08/19 2219   PRN Meds:   Allergies:   No Known Allergies  Social History:   Social History   Socioeconomic History  . Marital status: Married    Spouse name: Not on file  . Number of children: Not on file  . Years of education: Not on file  . Highest education level: Not on file  Occupational History  . Occupation: employed Counselling psychologist work    Fish farm manager: [employer]  Tobacco Use  . Smoking status: Never Smoker  . Smokeless tobacco: Never Used  Substance and Sexual Activity  . Alcohol use: No  . Drug use: No  . Sexual activity: Not on file  Other Topics Concern  . Not on file    Social History Narrative  . Not on file   Social Determinants of Health   Financial Resource Strain:   . Difficulty of Paying Living Expenses: Not on file  Food Insecurity:   . Worried About Charity fundraiser in the Last Year: Not on file  . Ran Out of Food in the Last Year: Not on file  Transportation Needs:   . Lack of Transportation (Medical): Not on file  . Lack of Transportation (Non-Medical): Not on file  Physical Activity:   . Days of Exercise per Week: Not on file  . Minutes of Exercise per Session: Not on file  Stress:   . Feeling of Stress : Not on file  Social Connections:   . Frequency of Communication with Friends and Family: Not on file  . Frequency of Social Gatherings with Friends and Family: Not on file  . Attends Religious Services: Not on file  . Active Member of Clubs or Organizations: Not on file  . Attends Archivist Meetings: Not on file  . Marital Status: Not on file  Intimate Partner Violence:   . Fear of Current or Ex-Partner: Not on file  . Emotionally Abused: Not on file  . Physically Abused: Not on file  . Sexually Abused: Not on file    Family History:    Family History  Problem Relation Age of Onset  . Diabetes Mother   . Stroke Brother   . Heart attack Sister      ROS:  Please see the history of present illness.  All other ROS reviewed and negative.     Physical Exam/Data:   Vitals:   11/08/19 2157 11/08/19 2202  BP:  (!) 95/53  Pulse:  97  Resp:  (!) 24  Temp:  100.2 F (37.9 C)  TempSrc:  Oral  SpO2: 96% 93%   No intake or output data in the 24 hours ending 11/08/19 2307 Last 3 Weights 08/28/2019 10/10/2018 06/26/2018  Weight (lbs) 202 lb 194 lb 198 lb  Weight (kg) 91.627 kg 87.998 kg 89.812 kg     There is no height or weight on file to calculate BMI.  General:  Well nourished, lying comfortably in bed  HEENT: normal Neck: no JVD Vascular: No carotid bruits; FA pulses 2+ bilaterally without bruits  Cardiac:   normal S1, S2; RRR  Lungs:  Breathing comfortably on 5L [place] Abd: soft, nontender, no hepatomegaly  Ext: no edema Musculoskeletal:  No deformities, BUE and BLE strength normal and equal Skin: warm and dry  Neuro:  CNs 2-12 intact, no focal abnormalities noted Psych:  Normal affect   EKG:  The EKG was personally reviewed and demonstrates:    -  First three ECGs with normal sinus rhythm with isoelectric S-T segment with very broad and abnormal T- waves  - Fourth ECG at 22:09 with normal sinus rhythm with resolution of abnormal T-waves Telemetry:  Telemetry was personally reviewed and demonstrates:  Normal sinus rhythm, rare PVC  Relevant CV Studies: None in the system  Laboratory Data:  High Sensitivity Troponin:   Recent Labs  Lab 11/08/19 2204  TROPONINIHS 13     Chemistry Recent Labs  Lab 11/08/19 2226  NA 141  K 4.6  CL 110  GLUCOSE 235*  BUN 30*  CREATININE 1.50*    No results for input(s): PROT, ALBUMIN, AST, ALT, ALKPHOS, BILITOT in the last 168 hours. Hematology Recent Labs  Lab 11/08/19 2204 11/08/19 2226  WBC 6.6  --   RBC 4.38  --   HGB 12.9* 13.3  HCT 39.2 39.0  MCV 89.5  --   MCH 29.5  --   MCHC 32.9  --   RDW 12.6  --   PLT 173  --    BNPNo results for input(s): BNP, PROBNP in the last 168 hours.  DDimer  Recent Labs  Lab 11/08/19 2212  DDIMER 1.51*     Radiology/Studies:  No results found. { HEAR Score (for undifferentiated chest pain):     5     Assessment and Plan:   1. Abnormal ECG- Mr. Hilbun presents with worsening shortness of breath and chest tightness in the setting of known COVID infection. He has profoundly abnormal T-waves on his initial three ECGs. His chest tightness and shortness of breath improved with oxygen therapy which is atypical for ACS. Reassuringly, his initial troponin is only 13 and he did not have any angina symptoms prior to his COVID infection. Suspect his abnormal ECG is related to his COVID infection  (although remains odd that it corrected so quickly with oxygen therapy alone). Given his normal troponin, resolution of abnormal ECG findings, and improvement in symptoms with oxygen therapy, he does not warrant further cardiac work-up at this time - Monitor on tele  - Continue home cardiac medications - Cardiology will continue to follow       For questions or updates, please contact [place] Please consult [email or web address] for contact info under    Signed, Princella Pellegrini, MD  11/08/2019 11:07 PM

## 2019-11-08 NOTE — ED Provider Notes (Signed)
[facility] EMERGENCY DEPARTMENT Provider Note   CSN: [id number] Arrival date & time: 11/08/19  2143     History Chief Complaint  Patient presents with  . Shortness of Breath  . Covid +  . Fever    Mathew Clements is a 74 y.o. male.   Shortness of Breath Severity:  Moderate Onset quality:  Gradual Duration:  2 days Timing:  Constant Progression:  Worsening Chronicity:  New Context: activity   Context comment:  Recent COVID dx Relieved by:  Oxygen Worsened by:  Activity Ineffective treatments:  None tried Associated symptoms: chest pain, cough and fever   Associated symptoms: no abdominal pain, no ear pain, no rash, no sore throat and no vomiting   Fever Associated symptoms: chest pain and cough   Associated symptoms: no chills, no dysuria, no ear pain, no rash, no sore throat and no vomiting     Past Medical History:  Diagnosis Date  . Anemia   . Arthritis   . CAD (coronary artery disease)    with an ejection fraction of 65%  . Cancer ([place])    skin cancer right ear  . Diabetes mellitus   . H/O: Bell's palsy   . Hemorrhoid   . HTN (hypertension)   . Hyperlipemia   . Kidney stone   . Myocardial infarction [facility])     Patient Active Problem List   Diagnosis Date Noted  . Acute respiratory failure with hypoxia ([place]) 11/09/2019  . Educated about COVID-19 virus infection 10/10/2018  . Coronary artery disease involving native coronary artery of native heart without angina pectoris 10/10/2018  . Left knee DJD 05/09/2012    Class: Chronic  . Hyperlipidemia 11/20/2010  . Obesity 11/20/2010  . HTN (hypertension)   . Diabetes mellitus   . Anemia   . CAD (coronary artery disease)   . History of kidney stones   . H/O: Bell's palsy     Past Surgical History:  Procedure Laterality Date  . BACK SURGERY    . CARDIAC CATHETERIZATION  10/15/2010   Dr Percival Spanish  . CORONARY ARTERY BYPASS GRAFT  10/20/2010   x 3, Dr Servando Snare  . TOTAL KNEE  ARTHROPLASTY Left 05/09/2012   Procedure: TOTAL KNEE ARTHROPLASTY;  Surgeon: Hessie Dibble, MD;  Location: [place];  Service: Orthopedics;  Laterality: Left;       Family History  Problem Relation Age of Onset  . Diabetes Mother   . Stroke Brother   . Heart attack Sister     Social History   Tobacco Use  . Smoking status: Never Smoker  . Smokeless tobacco: Never Used  Substance Use Topics  . Alcohol use: No  . Drug use: No    Home Medications Prior to Admission medications   Medication Sig Start Date End Date Taking? Authorizing Provider  amLODipine (NORVASC) 10 MG tablet Take 10 mg by mouth daily.    [provider]  aspirin 81 MG EC tablet Take 81 mg by mouth daily. Swallow whole.    [provider]  atorvastatin (LIPITOR) 40 MG tablet Take 40 mg by mouth daily.    [provider]  glipiZIDE (GLUCOTROL XL) 10 MG 24 hr tablet Take 10 mg by mouth daily with breakfast.    [provider]  losartan (COZAAR) 100 MG tablet Take 100 mg by mouth daily.    [provider]  metFORMIN (GLUCOPHAGE) 1000 MG tablet Take 1,000 mg by mouth 2 (two) times daily with a  meal.    [provider]  metoprolol tartrate (LOPRESSOR) 25 MG tablet Take 25 mg by mouth 2 (two) times daily.    [provider]  nitroGLYCERIN (NITROSTAT) 0.4 MG SL tablet Place 1 tablet (0.4 mg total) under the tongue every 5 (five) minutes as needed for chest pain. 08/28/19 11/26/19  Minus Breeding, MD    Allergies    Patient has no known allergies.  Review of Systems   Review of Systems  Constitutional: Positive for fever. Negative for chills.  HENT: Negative for ear pain and sore throat.   Eyes: Negative for pain and visual disturbance.  Respiratory: Positive for cough and shortness of breath.   Cardiovascular: Positive for chest pain. Negative for palpitations.  Gastrointestinal: Negative for abdominal pain and vomiting.  Genitourinary: Negative for  dysuria and hematuria.  Musculoskeletal: Negative for arthralgias and back pain.  Skin: Negative for color change and rash.  Neurological: Negative for seizures and syncope.  All other systems reviewed and are negative.   Physical Exam Updated Vital Signs BP 117/67 (BP Location: Right Arm)   Pulse 74   Temp 100.2 F (37.9 C) (Oral)   Resp (!) 24   SpO2 97%   Physical Exam Vitals and nursing note reviewed.  Constitutional:      Appearance: He is well-developed.  HENT:     Head: Normocephalic and atraumatic.  Eyes:     Conjunctiva/sclera: Conjunctivae normal.  Cardiovascular:     Rate and Rhythm: Normal rate and regular rhythm.     Heart sounds: No murmur heard.   Pulmonary:     Effort: Pulmonary effort is normal. No respiratory distress.     Breath sounds: Normal breath sounds. No decreased breath sounds, wheezing, rhonchi or rales.  Chest:     Chest wall: No mass or deformity.  Abdominal:     Palpations: Abdomen is soft.     Tenderness: There is no abdominal tenderness.  Musculoskeletal:     Cervical back: Neck supple.     Right lower leg: No edema.     Left lower leg: No edema.  Skin:    General: Skin is warm and dry.     Capillary Refill: Capillary refill takes less than 2 seconds.  Neurological:     General: No focal deficit present.     Mental Status: He is alert and oriented to person, place, and time.     Cranial Nerves: No cranial nerve deficit.     Motor: No weakness.     ED Results / Procedures / Treatments   Labs (all labs ordered are listed, but only abnormal results are displayed) Labs Reviewed  HEMOGLOBIN A1C - Abnormal; Notable for the following components:      Result Value   Hgb A1c MFr Bld 7.3 (*)    All other components within normal limits  CBC WITH DIFFERENTIAL/PLATELET - Abnormal; Notable for the following components:   Hemoglobin 12.9 (*)    All other components within normal limits  COMPREHENSIVE METABOLIC PANEL - Abnormal; Notable  for the following components:   CO2 19 (*)    Glucose, Bld 230 (*)    BUN 26 (*)    Creatinine, Ser 1.59 (*)    Albumin 3.4 (*)    AST 49 (*)    GFR calc non Af Amer 43 (*)    GFR calc Af Amer 50 (*)    All other components within normal limits  LIPID PANEL - Abnormal; Notable for the following  components:   HDL 29 (*)    All other components within normal limits  D-DIMER, QUANTITATIVE (NOT AT [facility]) - Abnormal; Notable for the following components:   D-Dimer, Quant 1.51 (*)    All other components within normal limits  LACTATE DEHYDROGENASE - Abnormal; Notable for the following components:   LDH 296 (*)    All other components within normal limits  FIBRINOGEN - Abnormal; Notable for the following components:   Fibrinogen 654 (*)    All other components within normal limits  I-STAT CHEM 8, ED - Abnormal; Notable for the following components:   BUN 30 (*)    Creatinine, Ser 1.50 (*)    Glucose, Bld 235 (*)    TCO2 20 (*)    All other components within normal limits  SARS CORONAVIRUS 2 BY RT PCR (HOSPITAL ORDER, [place] [facility])  CULTURE, BLOOD (ROUTINE X 2)  CULTURE, BLOOD (ROUTINE X 2)  PROTIME-INR  APTT  TRIGLYCERIDES  LACTIC ACID, PLASMA  LACTIC ACID, PLASMA  PROCALCITONIN  FERRITIN  C-REACTIVE PROTEIN  TROPONIN I (HIGH SENSITIVITY)  TROPONIN I (HIGH SENSITIVITY)    EKG EKG Interpretation  Date/Time:  Thursday November 08 2019 22:08:16 EDT Ventricular Rate:  87 PR Interval:  160 QRS Duration: 91 QT Interval:  356 QTC Calculation: 431 R Axis:   114 Text Interpretation: Sinus rhythm Left posterior fascicular block Low voltage with right axis deviation Borderline ST elevation, anterior leads Baseline wander in lead(s) V6 ST elevation resolved Confirmed by Wandra Arthurs [id number]) on 11/08/2019 10:13:03 PM   Radiology DG Chest Port 1 View  Result Date: 11/08/2019 CLINICAL DATA:  Hypoxia with COVID. Increased shortness of breath. Cough and fever.  EXAM: PORTABLE CHEST 1 VIEW COMPARISON:  05/11/2016 FINDINGS: Post median sternotomy. Mild cardiomegaly with unchanged mediastinal contours. Vague patchy opacities in the periphery of both mid lower lung zones. No pleural fluid or pneumothorax. Chronic change of both shoulders. IMPRESSION: Vague patchy opacities in the periphery of both mid lower lung zones, suspicious for multifocal pneumonia, pattern typical of COVID-19. Electronically Signed   By: Keith Rake M.D.   On: 11/08/2019 23:54    Procedures Procedures (including critical care time)  Medications Ordered in ED Medications  0.9 %  sodium chloride infusion ( Intravenous New Bag/Given 11/08/19 2219)  aspirin chewable tablet 324 mg (324 mg Oral Given 11/08/19 2217)  heparin injection 4,000 Units (4,000 Units Intravenous Given 11/08/19 2218)    ED Course  I have reviewed the triage vital signs and the nursing notes.  Pertinent labs & imaging results that were available during my care of the patient were reviewed by me and considered in my medical decision making (see chart for details).    MDM Rules/Calculators/A&P                          74 year old male with history of hypertension, diabetes, no coronary artery disease, status post triple bypass in 2012 who presents with chest pain shortness of breath in the setting of recent Covid diagnosis.  Patient states that he tested positive for Covid approximately 5 days ago.  For the past 2 days he has been having worsening shortness of breath as well as pressure-like chest pain with exertion.  States that this pain feels similar to previous heart attacks however his chest pain has improved since arrival to the emergency room.  Endorses intermittent fevers, chills but denies syncope, abdominal pain, vomiting, diarrhea.  Afebrile vitals stable.  Exam as above.  No focal abnormalities on physical exam.  EKG done in triage notable for possible ST elevations concerning for STEMI.  Spoke with  STEMI physician on-call who recommended that STEMI not be called however cardiology will be consulted while patient is in the emergency department..  Patient was given aspirin as well as heparin for concern for STEMI and Covid as well as troponin sent.  Initial troponin 13.  Creatinine 1.6 with unknown baseline.  Cardiology evaluated patient's EKG in no unclear etiology of elevated ST segments feel that this may be secondary to Covid myocarditis.  Recommend the patient be admitted to medicine and they can follow along.  On reevaluation patient currently on 4 to 5 L of oxygen satting mid 90s.  Given concerning EKGs, new oxygen requirement patient will require admission.  Discussed the case with the hospitalist service who evaluated the patient and agreed with admission to their service.  Patient was admitted to hospitalist service in stable condition without further events.  No further events occurred during the duration of my shift.  Final Clinical Impression(s) / ED Diagnoses Final diagnoses:  [id number]    Rx / DC Orders ED Discharge Orders    None       Silvestre Gunner, MD 11/09/19 Lynnell Catalan    Drenda Freeze, MD 11/12/19 [phone]

## 2019-11-08 NOTE — ED Triage Notes (Signed)
Patient presents to the ED by EMS with c/o known covid + having increased shortness of breath using 4L [facility] does not normally use oxygen, cough, fever given 1000 mg Tylenol.    ekg at triage reading STEMI notification given. Waiting verification Labs being drawn.

## 2019-11-08 NOTE — ED Notes (Signed)
Code stemi activated per Dr. Bethann Punches request

## 2019-11-09 DIAGNOSIS — U071 COVID-19: Secondary | ICD-10-CM | POA: Diagnosis present

## 2019-11-09 DIAGNOSIS — E785 Hyperlipidemia, unspecified: Secondary | ICD-10-CM | POA: Diagnosis present

## 2019-11-09 DIAGNOSIS — J9601 Acute respiratory failure with hypoxia: Secondary | ICD-10-CM

## 2019-11-09 DIAGNOSIS — J1282 Pneumonia due to coronavirus disease 2019: Secondary | ICD-10-CM | POA: Diagnosis present

## 2019-11-09 DIAGNOSIS — I2510 Atherosclerotic heart disease of native coronary artery without angina pectoris: Secondary | ICD-10-CM | POA: Diagnosis present

## 2019-11-09 DIAGNOSIS — E1165 Type 2 diabetes mellitus with hyperglycemia: Secondary | ICD-10-CM | POA: Diagnosis not present

## 2019-11-09 DIAGNOSIS — N179 Acute kidney failure, unspecified: Secondary | ICD-10-CM | POA: Diagnosis present

## 2019-11-09 DIAGNOSIS — Z833 Family history of diabetes mellitus: Secondary | ICD-10-CM | POA: Diagnosis not present

## 2019-11-09 DIAGNOSIS — I445 Left posterior fascicular block: Secondary | ICD-10-CM | POA: Diagnosis present

## 2019-11-09 DIAGNOSIS — Z85828 Personal history of other malignant neoplasm of skin: Secondary | ICD-10-CM | POA: Diagnosis not present

## 2019-11-09 DIAGNOSIS — A084 Viral intestinal infection, unspecified: Secondary | ICD-10-CM | POA: Diagnosis present

## 2019-11-09 DIAGNOSIS — R079 Chest pain, unspecified: Secondary | ICD-10-CM

## 2019-11-09 DIAGNOSIS — A419 Sepsis, unspecified organism: Secondary | ICD-10-CM

## 2019-11-09 DIAGNOSIS — E872 Acidosis: Secondary | ICD-10-CM | POA: Diagnosis present

## 2019-11-09 DIAGNOSIS — I252 Old myocardial infarction: Secondary | ICD-10-CM | POA: Diagnosis not present

## 2019-11-09 DIAGNOSIS — Z7984 Long term (current) use of oral hypoglycemic drugs: Secondary | ICD-10-CM | POA: Diagnosis not present

## 2019-11-09 DIAGNOSIS — T380X5A Adverse effect of glucocorticoids and synthetic analogues, initial encounter: Secondary | ICD-10-CM | POA: Diagnosis not present

## 2019-11-09 DIAGNOSIS — Z823 Family history of stroke: Secondary | ICD-10-CM | POA: Diagnosis not present

## 2019-11-09 DIAGNOSIS — Z96652 Presence of left artificial knee joint: Secondary | ICD-10-CM | POA: Diagnosis present

## 2019-11-09 DIAGNOSIS — Z7982 Long term (current) use of aspirin: Secondary | ICD-10-CM | POA: Diagnosis not present

## 2019-11-09 DIAGNOSIS — Y92239 Unspecified place in hospital as the place of occurrence of the external cause: Secondary | ICD-10-CM | POA: Diagnosis not present

## 2019-11-09 DIAGNOSIS — Z87442 Personal history of urinary calculi: Secondary | ICD-10-CM | POA: Diagnosis not present

## 2019-11-09 DIAGNOSIS — Z951 Presence of aortocoronary bypass graft: Secondary | ICD-10-CM | POA: Diagnosis not present

## 2019-11-09 DIAGNOSIS — Z8249 Family history of ischemic heart disease and other diseases of the circulatory system: Secondary | ICD-10-CM | POA: Diagnosis not present

## 2019-11-09 DIAGNOSIS — Z79899 Other long term (current) drug therapy: Secondary | ICD-10-CM | POA: Diagnosis not present

## 2019-11-09 DIAGNOSIS — I10 Essential (primary) hypertension: Secondary | ICD-10-CM | POA: Diagnosis present

## 2019-11-09 LAB — BLOOD CULTURE ID PANEL (REFLEXED) - BCID2
A.calcoaceticus-baumannii: NOT DETECTED
Bacteroides fragilis: NOT DETECTED
Candida albicans: NOT DETECTED
Candida auris: NOT DETECTED
Candida glabrata: NOT DETECTED
Candida krusei: NOT DETECTED
Candida parapsilosis: NOT DETECTED
Candida tropicalis: NOT DETECTED
Cryptococcus neoformans/gattii: NOT DETECTED
Enterobacter cloacae complex: NOT DETECTED
Enterobacterales: NOT DETECTED
Enterococcus Faecium: NOT DETECTED
Enterococcus faecalis: NOT DETECTED
Escherichia coli: NOT DETECTED
Haemophilus influenzae: NOT DETECTED
Klebsiella aerogenes: NOT DETECTED
Klebsiella oxytoca: NOT DETECTED
Klebsiella pneumoniae: NOT DETECTED
Listeria monocytogenes: NOT DETECTED
Methicillin resistance mecA/C: NOT DETECTED
Neisseria meningitidis: NOT DETECTED
Proteus species: NOT DETECTED
Pseudomonas aeruginosa: NOT DETECTED
Salmonella species: NOT DETECTED
Serratia marcescens: NOT DETECTED
Staphylococcus aureus (BCID): NOT DETECTED
Staphylococcus epidermidis: DETECTED — AB
Staphylococcus lugdunensis: NOT DETECTED
Staphylococcus species: DETECTED — AB
Stenotrophomonas maltophilia: NOT DETECTED
Streptococcus agalactiae: NOT DETECTED
Streptococcus pneumoniae: NOT DETECTED
Streptococcus pyogenes: NOT DETECTED
Streptococcus species: DETECTED — AB

## 2019-11-09 LAB — LACTIC ACID, PLASMA
Lactic Acid, Venous: 1.7 mmol/L (ref 0.5–1.9)
Lactic Acid, Venous: 2.1 mmol/L (ref 0.5–1.9)

## 2019-11-09 LAB — BASIC METABOLIC PANEL
Anion gap: 11 (ref 5–15)
BUN: 26 mg/dL — ABNORMAL HIGH (ref 8–23)
CO2: 19 mmol/L — ABNORMAL LOW (ref 22–32)
Calcium: 8.4 mg/dL — ABNORMAL LOW (ref 8.9–10.3)
Chloride: 112 mmol/L — ABNORMAL HIGH (ref 98–111)
Creatinine, Ser: 1.31 mg/dL — ABNORMAL HIGH (ref 0.61–1.24)
GFR calc Af Amer: >60 mL/min (ref >60)
GFR calc non Af Amer: 54 mL/min — ABNORMAL LOW (ref >60)
Glucose, Bld: 252 mg/dL — ABNORMAL HIGH (ref 70–99)
Potassium: 4.6 mmol/L (ref 3.5–5.1)
Sodium: 142 mmol/L (ref 135–145)

## 2019-11-09 LAB — CBG MONITORING, ED
Glucose-Capillary: 128 mg/dL — ABNORMAL HIGH (ref 70–99)
Glucose-Capillary: 254 mg/dL — ABNORMAL HIGH (ref 70–99)
Glucose-Capillary: 254 mg/dL — ABNORMAL HIGH (ref 70–99)
Glucose-Capillary: 204 mg/dL — ABNORMAL HIGH (ref 70–99)
Glucose-Capillary: 181 mg/dL — ABNORMAL HIGH (ref 70–99)

## 2019-11-09 LAB — PROCALCITONIN: Procalcitonin: <0.1 ng/mL

## 2019-11-09 LAB — C-REACTIVE PROTEIN: CRP: 6 mg/dL — ABNORMAL HIGH (ref <1.0)

## 2019-11-09 LAB — TROPONIN I (HIGH SENSITIVITY): Troponin I (High Sensitivity): 14 ng/L (ref <18)

## 2019-11-09 LAB — FERRITIN: Ferritin: 2014 ng/mL — ABNORMAL HIGH (ref 24–336)

## 2019-11-09 LAB — SARS CORONAVIRUS 2 BY RT PCR (HOSPITAL ORDER, PERFORMED IN ~~LOC~~ HOSPITAL LAB): SARS Coronavirus 2: POSITIVE — AB

## 2019-11-09 MED ORDER — BARICITINIB 2 MG PO TABS
2.0000 mg | ORAL_TABLET | Freq: Every day | ORAL | Status: DC
  Administered 2019-11-09 – 2019-11-12 (×4): 2 mg via ORAL
  Filled 2019-11-09 (×4): qty 1

## 2019-11-09 MED ORDER — NETARSUDIL-LATANOPROST 0.02-0.005 % OP SOLN
1.0000 [drp] | Freq: Every day | OPHTHALMIC | Status: DC
  Administered 2019-11-10 – 2019-11-12 (×2): 1 [drp] via OPHTHALMIC

## 2019-11-09 MED ORDER — HYDROCOD POLST-CPM POLST ER 10-8 MG/5ML PO SUER
5.0000 mL | Freq: Two times a day (BID) | ORAL | Status: DC | PRN
  Administered 2019-11-10: 5 mL via ORAL
  Filled 2019-11-09: qty 5

## 2019-11-09 MED ORDER — METHYLPREDNISOLONE SODIUM SUCC 125 MG IJ SOLR
0.5000 mg/kg | Freq: Two times a day (BID) | INTRAMUSCULAR | Status: DC
  Administered 2019-11-09 – 2019-11-13 (×9): 45.625 mg via INTRAVENOUS
  Filled 2019-11-09 (×9): qty 2

## 2019-11-09 MED ORDER — INSULIN ASPART 100 UNIT/ML ~~LOC~~ SOLN
0.0000 [IU] | Freq: Every day | SUBCUTANEOUS | Status: DC
  Administered 2019-11-10: 3 [IU] via SUBCUTANEOUS
  Administered 2019-11-11: 5 [IU] via SUBCUTANEOUS
  Administered 2019-11-12: 2 [IU] via SUBCUTANEOUS

## 2019-11-09 MED ORDER — ZINC SULFATE 220 (50 ZN) MG PO CAPS
220.0000 mg | ORAL_CAPSULE | Freq: Every day | ORAL | Status: DC
  Administered 2019-11-09 – 2019-11-13 (×5): 220 mg via ORAL
  Filled 2019-11-09 (×5): qty 1

## 2019-11-09 MED ORDER — HEPARIN SODIUM (PORCINE) 5000 UNIT/ML IJ SOLN
5000.0000 [IU] | Freq: Three times a day (TID) | INTRAMUSCULAR | Status: DC
  Administered 2019-11-09 – 2019-11-13 (×14): 5000 [IU] via SUBCUTANEOUS
  Filled 2019-11-09 (×14): qty 1

## 2019-11-09 MED ORDER — SODIUM CHLORIDE 0.9 % IV SOLN: INTRAVENOUS | Status: DC

## 2019-11-09 MED ORDER — ASCORBIC ACID 500 MG PO TABS
500.0000 mg | ORAL_TABLET | Freq: Every day | ORAL | Status: DC
  Administered 2019-11-09 – 2019-11-13 (×5): 500 mg via ORAL
  Filled 2019-11-09 (×5): qty 1

## 2019-11-09 MED ORDER — ACETAMINOPHEN 325 MG PO TABS
650.0000 mg | ORAL_TABLET | Freq: Four times a day (QID) | ORAL | Status: DC | PRN
  Administered 2019-11-10 (×2): 650 mg via ORAL
  Filled 2019-11-09 (×2): qty 2

## 2019-11-09 MED ORDER — LOPERAMIDE HCL 2 MG PO CAPS: 2.0000 mg | ORAL_CAPSULE | ORAL | Status: DC | PRN

## 2019-11-09 MED ORDER — GUAIFENESIN-DM 100-10 MG/5ML PO SYRP
10.0000 mL | ORAL_SOLUTION | ORAL | Status: DC | PRN
  Administered 2019-11-09 – 2019-11-10 (×4): 10 mL via ORAL
  Filled 2019-11-09 (×4): qty 10

## 2019-11-09 MED ORDER — VITAMIN D 25 MCG (1000 UNIT) PO TABS
1000.0000 [IU] | ORAL_TABLET | Freq: Every day | ORAL | Status: DC
  Administered 2019-11-09 – 2019-11-13 (×5): 1000 [IU] via ORAL
  Filled 2019-11-09 (×5): qty 1

## 2019-11-09 MED ORDER — INSULIN ASPART 100 UNIT/ML ~~LOC~~ SOLN
0.0000 [IU] | Freq: Three times a day (TID) | SUBCUTANEOUS | Status: DC
  Administered 2019-11-09 (×2): 5 [IU] via SUBCUTANEOUS
  Administered 2019-11-09: 1 [IU] via SUBCUTANEOUS
  Administered 2019-11-10: 5 [IU] via SUBCUTANEOUS
  Administered 2019-11-10: 3 [IU] via SUBCUTANEOUS
  Administered 2019-11-10: 9 [IU] via SUBCUTANEOUS
  Administered 2019-11-11: 7 [IU] via SUBCUTANEOUS
  Administered 2019-11-11: 5 [IU] via SUBCUTANEOUS
  Administered 2019-11-11: 7 [IU] via SUBCUTANEOUS
  Administered 2019-11-12 (×2): 9 [IU] via SUBCUTANEOUS
  Administered 2019-11-12: 3 [IU] via SUBCUTANEOUS
  Administered 2019-11-13: 2 [IU] via SUBCUTANEOUS
  Administered 2019-11-13: 7 [IU] via SUBCUTANEOUS

## 2019-11-09 MED ORDER — SODIUM CHLORIDE 0.9 % IV SOLN
200.0000 mg | Freq: Once | INTRAVENOUS | Status: AC
  Administered 2019-11-09: 200 mg via INTRAVENOUS
  Filled 2019-11-09: qty 200

## 2019-11-09 MED ORDER — SODIUM CHLORIDE 0.9 % IV SOLN
100.0000 mg | Freq: Every day | INTRAVENOUS | Status: AC
  Administered 2019-11-10 – 2019-11-13 (×4): 100 mg via INTRAVENOUS
  Filled 2019-11-09 (×4): qty 20

## 2019-11-09 NOTE — Progress Notes (Signed)
No charge note  Patient seen and examined this morning, admitted overnight.  H&P reviewed and agree with the assessment and plan.  This is a 74 year old male with history of CAD with CABG, type 2 diabetes mellitus, HTN, HLD who came to the hospital with shortness of breath, chest pain and fevers.  He tested positive for COVID-19 approximately 5 days ago.  He reports a week history of chills, cough, shortness of breath, chest tightness and diarrhea.  Did have nausea but no vomiting.  Has not been vaccinated against Covid.  In the ED he was febrile to 100.2, chest x-ray showed bilateral pneumonia and required 4 L of supplemental oxygen.  SARS-CoV-2 came back positive.  His initial EKG was concerning for STEMI and cardiology has been consulted.  Acute hypoxic respiratory failure due to Covid 19 pneumonia -Patient was started on remdesivir, continue for 5 days, along with steroids and was also started on bosutinib on admission.  Agree with all, continue  Chest pain, abnormal EKG with known CAD status post CABG-patient still with mild chest tightness this morning especially worse with coughing, this is likely due to his pneumonia.  He is high-sensitivity troponin is essentially negative.  Has been seen by cardiology   Acute kidney injury-has received fluids overnight, discontinued this morning as would like patient to be on the dry side.  Repeat BMP pending.  Type 2 diabetes mellitus-continue to monitor CBGs in the setting of steroid use  CBG (last 3)  Recent Labs    11/09/19 0805  GLUCAP 128*   Essential hypertension-blood pressure normal this morning, monitor.  Scheduled Meds: . vitamin C  500 mg Oral Daily  . baricitinib  2 mg Oral Daily  . cholecalciferol  1,000 Units Oral Daily  . heparin  5,000 Units Subcutaneous Q8H  . insulin aspart  0-5 Units Subcutaneous QHS  . insulin aspart  0-9 Units Subcutaneous TID WC  . methylPREDNISolone (SOLU-MEDROL) injection  0.5 mg/kg Intravenous Q12H   . zinc sulfate  220 mg Oral Daily   Continuous Infusions: . sodium chloride 20 mL/hr at 11/08/19 2219  . sodium chloride 125 mL/hr at 11/09/19 0438  . [START ON 11/10/2019] remdesivir 100 mg in NS 100 mL     PRN Meds:.acetaminophen, chlorpheniramine-HYDROcodone, guaiFENesin-dextromethorphan, loperamide  Hasaan Radde M. Cruzita Lederer, MD, PhD [facility]  Between 7 am - 7 pm you can contact me via [place] or [place].  I am not available 7 pm - 7 am, please contact night coverage MD/APP via Amion

## 2019-11-09 NOTE — Progress Notes (Signed)
PHARMACY - PHYSICIAN COMMUNICATION CRITICAL VALUE ALERT - BLOOD CULTURE IDENTIFICATION (BCID)  Mathew Clements is an 74 y.o. male who presented to [facility] on 11/08/2019 with a chief complaint of SOB, COVID +.   Assessment:  1 of 4 bottles (anaerobic) with GPC chains/clusters, BCID shows Staph epi + Strep species - likely contaminant   Name of physician (or Provider) Contacted: Dr Tonie Griffith  Current antibiotics: none  Changes to prescribed antibiotics recommended:  Recommendations accepted by provider - hold off on abx  Results for orders placed or performed during the hospital encounter of 11/08/19  Blood Culture ID Panel (Reflexed) (Collected: 11/08/2019 10:31 PM)  Result Value Ref Range   Enterococcus faecalis NOT DETECTED NOT DETECTED   Enterococcus Faecium NOT DETECTED NOT DETECTED   Listeria monocytogenes NOT DETECTED NOT DETECTED   Staphylococcus species DETECTED (A) NOT DETECTED   Staphylococcus aureus (BCID) NOT DETECTED NOT DETECTED   Staphylococcus epidermidis DETECTED (A) NOT DETECTED   Staphylococcus lugdunensis NOT DETECTED NOT DETECTED   Streptococcus species DETECTED (A) NOT DETECTED   Streptococcus agalactiae NOT DETECTED NOT DETECTED   Streptococcus pneumoniae NOT DETECTED NOT DETECTED   Streptococcus pyogenes NOT DETECTED NOT DETECTED   A.calcoaceticus-baumannii NOT DETECTED NOT DETECTED   Bacteroides fragilis NOT DETECTED NOT DETECTED   Enterobacterales NOT DETECTED NOT DETECTED   Enterobacter cloacae complex NOT DETECTED NOT DETECTED   Escherichia coli NOT DETECTED NOT DETECTED   Klebsiella aerogenes NOT DETECTED NOT DETECTED   Klebsiella oxytoca NOT DETECTED NOT DETECTED   Klebsiella pneumoniae NOT DETECTED NOT DETECTED   Proteus species NOT DETECTED NOT DETECTED   Salmonella species NOT DETECTED NOT DETECTED   Serratia marcescens NOT DETECTED NOT DETECTED   Haemophilus influenzae NOT DETECTED NOT DETECTED   Neisseria meningitidis NOT DETECTED NOT DETECTED    Pseudomonas aeruginosa NOT DETECTED NOT DETECTED   Stenotrophomonas maltophilia NOT DETECTED NOT DETECTED   Candida albicans NOT DETECTED NOT DETECTED   Candida auris NOT DETECTED NOT DETECTED   Candida glabrata NOT DETECTED NOT DETECTED   Candida krusei NOT DETECTED NOT DETECTED   Candida parapsilosis NOT DETECTED NOT DETECTED   Candida tropicalis NOT DETECTED NOT DETECTED   Cryptococcus neoformans/gattii NOT DETECTED NOT DETECTED   Methicillin resistance mecA/C NOT DETECTED NOT DETECTED    Pati Gallo 11/09/2019  11:38 PM

## 2019-11-09 NOTE — ED Notes (Signed)
Update has been given to daughter.

## 2019-11-09 NOTE — ED Notes (Signed)
Attempted to give daughter update per Pt request, no answer.

## 2019-11-09 NOTE — H&P (Signed)
History and Physical    Mathew Clements DOB: [birth year] DOA: 11/08/2019  PCP: Caren Macadam, MD Patient coming from: Home  Chief Complaint: Shortness of breath, Covid positive  HPI: Mathew Clements is a 74 y.o. male with medical history significant of CAD status post CABG, noninsulin-dependent type II diabetes, hypertension, hyperlipidemia presenting to the ED for evaluation of shortness of breath, chest pain, and fever.  Patient recently tested positive for COVID-19 approximately 5 days ago.  Reports 1 week history of chills, cough, shortness of breath, chest tightness, and diarrhea.  He previously felt nauseous but did not vomit.  Nausea has now resolved.  Denies abdominal pain.  He has not been vaccinated against Covid.  States his chest tightness has now resolved after he received oxygen in the ED.  ED Course: Febrile with temperature 100.2 F.  Not tachycardic. Slightly tachypneic.  Hypoxic to the 80s, requiring 4 L supplemental oxygen in the ED.  SARS-CoV-2 PCR test positive.  Labs showing no leukocytosis.  Lactic acid 2.1 >1.7.  Bicarb 19, anion gap 9.  Blood glucose 230.  BUN 26, creatinine 1.5.  Creatinine was previously 0.9 on labs done 3 years ago, no recent labs for comparison.  AST mildly elevated at 49, remainder of LFTs normal.  Procalcitonin pending.  Inflammatory markers elevated: D-dimer 1.51, LDH 296, ferritin 2014, fibrinogen 654, and CRP 6.0.  Blood culture x2 pending.  EKG done at triage notable for possible ST elevations concerning for STEMI.  Initial high-sensitivity troponin negative, repeat pending.  Cardiology was consulted.  Patient was given aspirin 325 mg and heparin.  EKG done later showing resolution of these findings.  Chest x-ray showing findings suspicious for multifocal pneumonia.  Review of Systems:  All systems reviewed and apart from history of presenting illness, are negative.  Past Medical History:  Diagnosis Date  . Anemia   . Arthritis    . CAD (coronary artery disease)    with an ejection fraction of 65%  . Cancer ([place])    skin cancer right ear  . Diabetes mellitus   . H/O: Bell's palsy   . Hemorrhoid   . HTN (hypertension)   . Hyperlipemia   . Kidney stone   . Myocardial infarction [facility])     Past Surgical History:  Procedure Laterality Date  . BACK SURGERY    . CARDIAC CATHETERIZATION  10/15/2010   Dr Percival Spanish  . CORONARY ARTERY BYPASS GRAFT  10/20/2010   x 3, Dr Servando Snare  . TOTAL KNEE ARTHROPLASTY Left 05/09/2012   Procedure: TOTAL KNEE ARTHROPLASTY;  Surgeon: Hessie Dibble, MD;  Location: [facility];  Service: Orthopedics;  Laterality: Left;     reports that he has never smoked. He has never used smokeless tobacco. He reports that he does not drink alcohol and does not use drugs.  No Known Allergies  Family History  Problem Relation Age of Onset  . Diabetes Mother   . Stroke Brother   . Heart attack Sister     Prior to Admission medications   Medication Sig Start Date End Date Taking? Authorizing Provider  amLODipine (NORVASC) 10 MG tablet Take 10 mg by mouth daily.    [provider]  aspirin 81 MG EC tablet Take 81 mg by mouth daily. Swallow whole.    [provider]  atorvastatin (LIPITOR) 40 MG tablet Take 40 mg by mouth daily.    [provider]  glipiZIDE (GLUCOTROL XL) 10 MG 24 hr tablet Take 10 mg  by mouth daily with breakfast.    [provider]  losartan (COZAAR) 100 MG tablet Take 100 mg by mouth daily.    [provider]  metFORMIN (GLUCOPHAGE) 1000 MG tablet Take 1,000 mg by mouth 2 (two) times daily with a meal.    [provider]  metoprolol tartrate (LOPRESSOR) 25 MG tablet Take 25 mg by mouth 2 (two) times daily.    [provider]  nitroGLYCERIN (NITROSTAT) 0.4 MG SL tablet Place 1 tablet (0.4 mg total) under the tongue every 5 (five) minutes as needed for chest pain. 08/28/19 11/26/19  Minus Breeding, MD    Physical  Exam: Vitals:   11/08/19 2300 11/08/19 2315 11/08/19 2330 11/08/19 2345  BP: (!) 95/54 121/64 111/73 117/67  Pulse: 79 80 81 74  Resp:    (!) 24  Temp:      TempSrc:      SpO2: 96% 98% 97% 97%    Physical Exam Constitutional:      Appearance: He is ill-appearing.  HENT:     Head: Normocephalic and atraumatic.  Eyes:     Extraocular Movements: Extraocular movements intact.     Conjunctiva/sclera: Conjunctivae normal.  Cardiovascular:     Rate and Rhythm: Normal rate and regular rhythm.     Pulses: Normal pulses.  Pulmonary:     Breath sounds: Rales present. No wheezing.     Comments: Tachypneic with respiratory rate in the 20s Abdominal:     General: Bowel sounds are normal. There is no distension.     Palpations: Abdomen is soft.     Tenderness: There is no abdominal tenderness. There is no guarding or rebound.  Musculoskeletal:        General: No swelling or tenderness.     Cervical back: Normal range of motion and neck supple.  Skin:    General: Skin is warm and dry.  Neurological:     General: No focal deficit present.     Mental Status: He is alert and oriented to person, place, and time.     Labs on Admission: I have personally reviewed following labs and imaging studies  CBC: Recent Labs  Lab 11/08/19 2204 11/08/19 2226  WBC 6.6  --   NEUTROABS 4.7  --   HGB 12.9* 13.3  HCT 39.2 39.0  MCV 89.5  --   PLT 173  --    Basic Metabolic Panel: Recent Labs  Lab 11/08/19 2204 11/08/19 2226  NA 137 141  K 4.4 4.6  CL 109 110  CO2 19*  --   GLUCOSE 230* 235*  BUN 26* 30*  CREATININE 1.59* 1.50*  CALCIUM 8.9  --    GFR: CrCl cannot be calculated (Unknown ideal weight.). Liver Function Tests: Recent Labs  Lab 11/08/19 2204  AST 49*  ALT 43  ALKPHOS 59  BILITOT 0.7  PROT 7.0  ALBUMIN 3.4*   No results for input(s): LIPASE, AMYLASE in the last 168 hours. No results for input(s): AMMONIA in the last 168 hours. Coagulation Profile: Recent Labs   Lab 11/08/19 2204  INR 1.1   Cardiac Enzymes: No results for input(s): CKTOTAL, CKMB, CKMBINDEX, TROPONINI in the last 168 hours. BNP (last 3 results) No results for input(s): PROBNP in the last 8760 hours. HbA1C: Recent Labs    11/08/19 2204  HGBA1C 7.3*   CBG: No results for input(s): GLUCAP in the last 168 hours. Lipid Profile: Recent Labs    11/08/19 2204 11/08/19 2212  CHOL 79  --  HDL 29*  --   LDLCALC 26  --   TRIG 122 128  CHOLHDL 2.7  --    Thyroid Function Tests: No results for input(s): TSH, T4TOTAL, FREET4, T3FREE, THYROIDAB in the last 72 hours. Anemia Panel: Recent Labs    11/08/19 2212  FERRITIN 2,014*   Urine analysis:    Component Value Date/Time   COLORURINE YELLOW 08/01/2013 1845   APPEARANCEUR CLEAR 08/01/2013 1845   LABSPEC 1.026 08/01/2013 1845   PHURINE 5.0 08/01/2013 1845   GLUCOSEU NEGATIVE 08/01/2013 1845   HGBUR NEGATIVE 08/01/2013 1845   BILIRUBINUR SMALL (A) 08/01/2013 1845   KETONESUR NEGATIVE 08/01/2013 1845   PROTEINUR NEGATIVE 08/01/2013 1845   UROBILINOGEN 0.2 08/01/2013 1845   NITRITE NEGATIVE 08/01/2013 1845   LEUKOCYTESUR NEGATIVE 08/01/2013 1845    Radiological Exams on Admission: DG Chest Port 1 View  Result Date: 11/08/2019 CLINICAL DATA:  Hypoxia with COVID. Increased shortness of breath. Cough and fever. EXAM: PORTABLE CHEST 1 VIEW COMPARISON:  05/11/2016 FINDINGS: Post median sternotomy. Mild cardiomegaly with unchanged mediastinal contours. Vague patchy opacities in the periphery of both mid lower lung zones. No pleural fluid or pneumothorax. Chronic change of both shoulders. IMPRESSION: Vague patchy opacities in the periphery of both mid lower lung zones, suspicious for multifocal pneumonia, pattern typical of COVID-19. Electronically Signed   By: Keith Rake M.D.   On: 11/08/2019 23:54    EKG: Independently reviewed.  Initial EKGs with ST elevations in inferior and anterolateral leads.  EKG done later  showing resolution of these findings.  Assessment/Plan Principal Problem:   Pneumonia due to COVID-19 virus Active Problems:   Sepsis ([place])   Acute respiratory failure with hypoxemia (H[facility])   Chest pain   AKI (acute kidney injury) ([place])   Sepsis and acute hypoxemic respiratory failure secondary to COVID-19 viral multifocal pneumonia: SARS-CoV-2 PCR test positive.  Febrile.  Slightly tachypneic.  Oxygen saturation in the 80s on room air, currently satting well on 4 L supplemental oxygen. Lactic acid 2.1 >1.7. Inflammatory markers elevated: D-dimer 1.51, LDH 296, ferritin 2014, fibrinogen 654, and CRP 6.0.  Chest x-ray showing findings suspicious for multifocal pneumonia. -Remdesivir dosing per pharmacy -IV Solu-Medrol 0.5 mg/kg every 12 hours -Baricitinib 2 mg once daily (based on renal function) -Vitamin C, zinc, vitamin D -Antitussives as needed -Tylenol as needed -Procalcitonin level pending -Daily CBC with differential, CMP, CRP, D-dimer -Encourage prone positioning -Airborne and contact precautions -Continuous pulse ox -Supplemental oxygen as needed to keep oxygen saturation above 90% -Blood culture x2 pending  Chest pain and abnormal EKG in the setting of known history of CAD status post CABG: EKG done at triage notable for possible ST elevations concerning for STEMI.  Initial high-sensitivity troponin negative, repeat pending.  Patient was given aspirin 325 mg and heparin.  EKG done later showing resolution of these findings.  Patient was seen by cardiology and his abnormal EKG was felt to be related to COVID-19 infection as his chest tightness/shortness of breath improved with oxygen therapy.  No further cardiac work-up recommended at this time. -Cardiac monitoring, second set of troponin pending.  Resume home aspirin, metoprolol, and Lipitor after pharmacy med rec is done.  COVID-19 viral gastroenteritis: Patient with complains of nausea and diarrhea.  No episodes of vomiting.   Nausea has now resolved.  Denies abdominal pain and abdominal exam benign. -Symptomatic management: Loperamide as needed  AKI: Suspect prerenal from dehydration in setting of acute viral illness and home losartan use.  BUN 26, creatinine  1.5.  Creatinine was previously 0.9 on labs done 3 years ago, no recent labs for comparison.   -IV fluid hydration.  Monitor renal function and urine output.  Avoid nephrotoxic agents.  Hold losartan.  Non-insulin-dependent type 2 diabetes: A1c 7.3. -Sliding scale insulin sensitive ACHS and CBG checks.  Mild normal anion gap metabolic acidosis: Suspect related to AKI and mild lactic acidosis.  Bicarb 19, anion gap 9. -Continue IV fluid hydration.  Repeat metabolic panel in a.m.  Hypertension: Currently normotensive. -Resume home metoprolol and amlodipine after pharmacy med rec is done.  Hold losartan given AKI.  DVT prophylaxis: Subcutaneous heparin Code Status: Patient wishes to be full code. Family Communication: No family available at this time. Disposition Plan: Status is: Inpatient  Remains inpatient appropriate because:IV treatments appropriate due to intensity of illness or inability to take PO and Inpatient level of care appropriate due to severity of illness   Dispo: The patient is from: Home              Anticipated d/c is to: Home              Anticipated d/c date is: 3 days              Patient currently is not medically stable to d/c.  The medical decision making on this patient was of high complexity and the patient is at high risk for clinical deterioration, therefore this is a level 3 visit.  Shela Leff MD [facility]  If 7PM-7AM, please contact night-coverage [email or web address]  11/09/2019, 2:07 AM

## 2019-11-09 NOTE — ED Notes (Signed)
CBG 181.  

## 2019-11-09 NOTE — ED Notes (Signed)
Breakfast ordered 

## 2019-11-10 DIAGNOSIS — N179 Acute kidney failure, unspecified: Secondary | ICD-10-CM

## 2019-11-10 DIAGNOSIS — J9601 Acute respiratory failure with hypoxia: Secondary | ICD-10-CM

## 2019-11-10 LAB — CBC WITH DIFFERENTIAL/PLATELET
Abs Immature Granulocytes: 0.02 10*3/uL (ref 0.00–0.07)
Basophils Absolute: 0 10*3/uL (ref 0.0–0.1)
Basophils Relative: 0 %
Eosinophils Absolute: 0 10*3/uL (ref 0.0–0.5)
Eosinophils Relative: 0 %
HCT: 35.9 % — ABNORMAL LOW (ref 39.0–52.0)
Hemoglobin: 11.8 g/dL — ABNORMAL LOW (ref 13.0–17.0)
Immature Granulocytes: 0 %
Lymphocytes Relative: 21 %
Lymphs Abs: 1.1 10*3/uL (ref 0.7–4.0)
MCH: 29.6 pg (ref 26.0–34.0)
MCHC: 32.9 g/dL (ref 30.0–36.0)
MCV: 90 fL (ref 80.0–100.0)
Monocytes Absolute: 0.5 10*3/uL (ref 0.1–1.0)
Monocytes Relative: 9 %
Neutro Abs: 3.7 10*3/uL (ref 1.7–7.7)
Neutrophils Relative %: 70 %
Platelets: 194 10*3/uL (ref 150–400)
RBC: 3.99 MIL/uL — ABNORMAL LOW (ref 4.22–5.81)
RDW: 12.6 % (ref 11.5–15.5)
WBC: 5.3 10*3/uL (ref 4.0–10.5)
nRBC: 0 % (ref 0.0–0.2)

## 2019-11-10 LAB — COMPREHENSIVE METABOLIC PANEL
ALT: 47 U/L — ABNORMAL HIGH (ref 0–44)
AST: 50 U/L — ABNORMAL HIGH (ref 15–41)
Albumin: 2.8 g/dL — ABNORMAL LOW (ref 3.5–5.0)
Alkaline Phosphatase: 57 U/L (ref 38–126)
Anion gap: 11 (ref 5–15)
BUN: 29 mg/dL — ABNORMAL HIGH (ref 8–23)
CO2: 18 mmol/L — ABNORMAL LOW (ref 22–32)
Calcium: 8.4 mg/dL — ABNORMAL LOW (ref 8.9–10.3)
Chloride: 112 mmol/L — ABNORMAL HIGH (ref 98–111)
Creatinine, Ser: 1.21 mg/dL (ref 0.61–1.24)
GFR calc Af Amer: >60 mL/min (ref >60)
GFR calc non Af Amer: 59 mL/min — ABNORMAL LOW (ref >60)
Glucose, Bld: 202 mg/dL — ABNORMAL HIGH (ref 70–99)
Potassium: 4.5 mmol/L (ref 3.5–5.1)
Sodium: 141 mmol/L (ref 135–145)
Total Bilirubin: 0.7 mg/dL (ref 0.3–1.2)
Total Protein: 6.4 g/dL — ABNORMAL LOW (ref 6.5–8.1)

## 2019-11-10 LAB — CBG MONITORING, ED
Glucose-Capillary: 225 mg/dL — ABNORMAL HIGH (ref 70–99)
Glucose-Capillary: 393 mg/dL — ABNORMAL HIGH (ref 70–99)
Glucose-Capillary: 262 mg/dL — ABNORMAL HIGH (ref 70–99)
Glucose-Capillary: 292 mg/dL — ABNORMAL HIGH (ref 70–99)

## 2019-11-10 LAB — C-REACTIVE PROTEIN: CRP: 4.9 mg/dL — ABNORMAL HIGH (ref <1.0)

## 2019-11-10 LAB — D-DIMER, QUANTITATIVE: D-Dimer, Quant: 1.01 ug/mL-FEU — ABNORMAL HIGH (ref 0.00–0.50)

## 2019-11-10 NOTE — Progress Notes (Signed)
PROGRESS NOTE  Mathew Clements XEN:[id number] DOB: [birth year] DOA: 11/08/2019 PCP: Caren Macadam, MD   LOS: 1 day   Brief Narrative / Interim history: 74 year old male with history of CAD with CABG, type 2 diabetes mellitus, HTN, HLD who came to the hospital with shortness of breath, chest pain and fevers.  He tested positive for COVID-19 approximately 5 days ago.  He reports a week history of chills, cough, shortness of breath, chest tightness and diarrhea.  Did have nausea but no vomiting.  Has not been vaccinated against Covid.  In the ED he was febrile to 100.2, chest x-ray showed bilateral pneumonia and required 4 L of supplemental oxygen.  SARS-CoV-2 came back positive.  His initial EKG was concerning for STEMI and cardiology has been consulted.  Subjective / 24h Interval events: Doing well this morning, overall he is feeling better, less short of breath with ambulation.  No chest pain, no abdominal pain, no nausea or vomiting  Assessment & Plan:  Principal Problem Acute Hypoxic Respiratory Failure due to Covid-19 Viral Illness -Started on remdesivir, continue, total of 5 days, today day 2 with end date on Tuesday, 8/24 -Started on steroids, continue -Started on baricitinib, continue.  LFTs are minimally elevated -OK with mild hypoxemia, goal at rest is > 85% SaO2 -encouraged the patient to sit up in chair in the daytime use I-S and flutter valve for pulmonary toiletry and then prone in bed when at night.    COVID-19 Labs  Recent Labs    11/08/19 2212 11/10/19 0337  DDIMER 1.51* 1.01*  FERRITIN 2,014*  --   LDH 296*  --   CRP 6.0* 4.9*    Lab Results  Component Value Date   SARSCOV2NAA POSITIVE (A) 11/08/2019   [place] Not Detected 01/23/2019    Active Problems Chest pain, abnormal EKG with known CAD status post CABG-patient still with mild chest tightness this morning especially worse with coughing, this is likely due to his pneumonia.  His high-sensitivity  troponin is essentially negative.  Has been seen by cardiology.  No chest pain, continue to monitor clinically   Acute kidney injury-resolved with fluids, creatinine is now 1.2..  Type 2 diabetes mellitus-continue to monitor CBGs in the setting of steroid use  CBG (last 3)  Recent Labs    11/09/19 2226 11/09/19 2327 11/10/19 0822  GLUCAP 204* 181* 225*    Scheduled Meds:  vitamin C  500 mg Oral Daily   baricitinib  2 mg Oral Daily   cholecalciferol  1,000 Units Oral Daily   heparin  5,000 Units Subcutaneous Q8H   insulin aspart  0-5 Units Subcutaneous QHS   insulin aspart  0-9 Units Subcutaneous TID WC   methylPREDNISolone (SOLU-MEDROL) injection  0.5 mg/kg Intravenous Q12H   Netarsudil-Latanoprost  1 drop Both Eyes Daily   zinc sulfate  220 mg Oral Daily   Continuous Infusions:  sodium chloride 20 mL/hr at 11/08/19 2219   remdesivir 100 mg in NS 100 mL     PRN Meds:.acetaminophen, chlorpheniramine-HYDROcodone, guaiFENesin-dextromethorphan, loperamide  DVT prophylaxis: heparin Code Status: Full code Family Communication: d/w daughter over the phone  Status is: Inpatient  Remains inpatient appropriate because:Inpatient level of care appropriate due to severity of illness   Dispo: The patient is from: Home              Anticipated d/c is to: Home              Anticipated d/c date is: 3 days  Patient currently is not medically stable to d/c.  Consultants:  Cardiology    Procedures:  none  Microbiology: none  Antibacterials: none   Objective: Vitals:   11/10/19 0430 11/10/19 0515 11/10/19 0615 11/10/19 0630  BP: 116/71 123/74 123/67 129/73  Pulse: 73 79 73 85  Resp: (!) 24 (!) 33 (!) 24 (!) 23  Temp:      TempSrc:      SpO2: 96% 90% 92% 95%  Weight:       No intake or output data in the 24 hours ending 11/10/19 0943 Filed Weights   11/09/19 0200  Weight: 91.6 kg    Examination:  Constitutional: NAD Eyes: no scleral  icterus ENMT: Mucous membranes are moist.  Neck: normal, supple Respiratory: Bibasilar rhonchi, no wheezing, no crackles.  Tachypneic, increased respiratory effort Cardiovascular: Regular rate and rhythm, no murmurs / rubs / gallops. No LE edema. Good peripheral pulses Abdomen: non distended, no tenderness. Bowel sounds positive.  Musculoskeletal: no clubbing / cyanosis.  Skin: no rashes Neurologic: CN 2-12 grossly intact. Strength 5/5 in all 4.    Data Reviewed: I have independently reviewed following labs and imaging studies   CBC: Recent Labs  Lab 11/08/19 2204 11/08/19 2226 11/10/19 0337  WBC 6.6  --  5.3  NEUTROABS 4.7  --  3.7  HGB 12.9* 13.3 11.8*  HCT 39.2 39.0 35.9*  MCV 89.5  --  90.0  PLT 173  --  174   Basic Metabolic Panel: Recent Labs  Lab 11/08/19 2204 11/08/19 2226 11/09/19 1406 11/10/19 0337  NA 137 141 142 141  K 4.4 4.6 4.6 4.5  CL 109 110 112* 112*  CO2 19*  --  19* 18*  GLUCOSE 230* 235* 252* 202*  BUN 26* 30* 26* 29*  CREATININE 1.59* 1.50* 1.31* 1.21  CALCIUM 8.9  --  8.4* 8.4*   GFR: Estimated Creatinine Clearance: 62.8 mL/min (by C-G formula based on SCr of 1.21 mg/dL). Liver Function Tests: Recent Labs  Lab 11/08/19 2204 11/10/19 0337  AST 49* 50*  ALT 43 47*  ALKPHOS 59 57  BILITOT 0.7 0.7  PROT 7.0 6.4*  ALBUMIN 3.4* 2.8*   No results for input(s): LIPASE, AMYLASE in the last 168 hours. No results for input(s): AMMONIA in the last 168 hours. Coagulation Profile: Recent Labs  Lab 11/08/19 2204  INR 1.1   Cardiac Enzymes: No results for input(s): CKTOTAL, CKMB, CKMBINDEX, TROPONINI in the last 168 hours. BNP (last 3 results) No results for input(s): PROBNP in the last 8760 hours. HbA1C: Recent Labs    11/08/19 2204  HGBA1C 7.3*   CBG: Recent Labs  Lab 11/09/19 1251 11/09/19 1621 11/09/19 2226 11/09/19 2327 11/10/19 0822  GLUCAP 254* 254* 204* 181* 225*   Lipid Profile: Recent Labs    11/08/19 2204  11/08/19 2212  CHOL 79  --   HDL 29*  --   LDLCALC 26  --   TRIG 122 128  CHOLHDL 2.7  --    Thyroid Function Tests: No results for input(s): TSH, T4TOTAL, FREET4, T3FREE, THYROIDAB in the last 72 hours. Anemia Panel: Recent Labs    11/08/19 2212  FERRITIN 2,014*   Urine analysis:    Component Value Date/Time   COLORURINE YELLOW 08/01/2013 1845   APPEARANCEUR CLEAR 08/01/2013 1845   LABSPEC 1.026 08/01/2013 1845   PHURINE 5.0 08/01/2013 1845   GLUCOSEU NEGATIVE 08/01/2013 1845   HGBUR NEGATIVE 08/01/2013 1845   BILIRUBINUR SMALL (A) 08/01/2013 1845  KETONESUR NEGATIVE 08/01/2013 1845   PROTEINUR NEGATIVE 08/01/2013 1845   UROBILINOGEN 0.2 08/01/2013 1845   NITRITE NEGATIVE 08/01/2013 1845   LEUKOCYTESUR NEGATIVE 08/01/2013 1845   Sepsis Labs: Invalid input(s): PROCALCITONIN, LACTICIDVEN  Recent Results (from the past 240 hour(s))  SARS Coronavirus 2 by RT PCR (hospital order, performed in [facility] hospital lab) Nasopharyngeal Nasopharyngeal Swab     Status: Abnormal   Collection Time: 11/08/19 10:31 PM   Specimen: Nasopharyngeal Swab  Result Value Ref Range Status   SARS Coronavirus 2 POSITIVE (A) NEGATIVE Final    Comment: RESULT CALLED TO, READ BACK BY AND VERIFIED WITH: A COSTALES RN 11/09/19 0015 JDW (NOTE) SARS-CoV-2 target nucleic acids are DETECTED  SARS-CoV-2 RNA is generally detectable in upper respiratory specimens  during the acute phase of infection.  Positive results are indicative  of the presence of the identified virus, but do not rule out bacterial infection or co-infection with other pathogens not detected by the test.  Clinical correlation with patient history and  other diagnostic information is necessary to determine patient infection status.  The expected result is negative.  Fact Sheet for Patients:   StrictlyIdeas.no   Fact Sheet for Healthcare Providers:   BankingDealers.co.za    This  test is not yet approved or cleared by the [place] FDA and  has been authorized for detection and/or diagnosis of SARS-CoV-2 by FDA under an Emergency Use Authorization (EUA).  This EUA will remain in effect (meaning this test c an be used) for the duration of  the COVID-19 declaration under Section 564(b)(1) of the Act, 21 U.S.C. section 360-bbb-3(b)(1), unless the authorization is terminated or revoked sooner.  Performed at [facility], [place] [address], [place], [place] [address]   Blood Culture (routine x 2)     Status: None (Preliminary result)   Collection Time: 11/08/19 10:31 PM   Specimen: BLOOD  Result Value Ref Range Status   Specimen Description BLOOD SITE NOT SPECIFIED  Final   Special Requests   Final    BOTTLES DRAWN AEROBIC AND ANAEROBIC Blood Culture results may not be optimal due to an inadequate volume of blood received in culture bottles   Culture  Setup Time   Final    ANAEROBIC BOTTLE ONLY GRAM POSITIVE COCCI IN CHAINS GRAM POSITIVE COCCI IN CLUSTERS Organism ID to follow CRITICAL RESULT CALLED TO, READ BACK BY AND VERIFIED WITH: [facility] 11/09/19 2333 JDW Performed at [facility], [place] [address], [place], [place] [address]    Culture GRAM POSITIVE COCCI  Final   Report Status PENDING  Incomplete  Blood Culture ID Panel (Reflexed)     Status: Abnormal   Collection Time: 11/08/19 10:31 PM  Result Value Ref Range Status   Enterococcus faecalis NOT DETECTED NOT DETECTED Final   Enterococcus Faecium NOT DETECTED NOT DETECTED Final   Listeria monocytogenes NOT DETECTED NOT DETECTED Final   Staphylococcus species DETECTED (A) NOT DETECTED Final    Comment: CRITICAL RESULT CALLED TO, READ BACK BY AND VERIFIED WITH: J Ulen PHARMD 11/09/19 2333 JDW    Staphylococcus aureus (BCID) NOT DETECTED NOT DETECTED Final   Staphylococcus epidermidis DETECTED (A) NOT DETECTED Final    Comment: CRITICAL RESULT CALLED TO, READ BACK BY AND VERIFIED  WITH: J  PHARMD 11/09/19 2333 JDW    Staphylococcus lugdunensis NOT DETECTED NOT DETECTED Final   Streptococcus species DETECTED (A) NOT DETECTED Final    Comment: Not Enterococcus species, Streptococcus agalactiae, Streptococcus  pyogenes, or Streptococcus pneumoniae. CRITICAL RESULT CALLED TO, READ BACK BY AND VERIFIED WITH: J [place] PHARMD 11/09/19 2333 JDW    Streptococcus agalactiae NOT DETECTED NOT DETECTED Final   Streptococcus pneumoniae NOT DETECTED NOT DETECTED Final   Streptococcus pyogenes NOT DETECTED NOT DETECTED Final   A.calcoaceticus-baumannii NOT DETECTED NOT DETECTED Final   Bacteroides fragilis NOT DETECTED NOT DETECTED Final   Enterobacterales NOT DETECTED NOT DETECTED Final   Enterobacter cloacae complex NOT DETECTED NOT DETECTED Final   Escherichia coli NOT DETECTED NOT DETECTED Final   Klebsiella aerogenes NOT DETECTED NOT DETECTED Final   Klebsiella oxytoca NOT DETECTED NOT DETECTED Final   Klebsiella pneumoniae NOT DETECTED NOT DETECTED Final   Proteus species NOT DETECTED NOT DETECTED Final   Salmonella species NOT DETECTED NOT DETECTED Final   Serratia marcescens NOT DETECTED NOT DETECTED Final   Haemophilus influenzae NOT DETECTED NOT DETECTED Final   Neisseria meningitidis NOT DETECTED NOT DETECTED Final   Pseudomonas aeruginosa NOT DETECTED NOT DETECTED Final   Stenotrophomonas maltophilia NOT DETECTED NOT DETECTED Final   Candida albicans NOT DETECTED NOT DETECTED Final   Candida auris NOT DETECTED NOT DETECTED Final   Candida glabrata NOT DETECTED NOT DETECTED Final   Candida krusei NOT DETECTED NOT DETECTED Final   Candida parapsilosis NOT DETECTED NOT DETECTED Final   Candida tropicalis NOT DETECTED NOT DETECTED Final   Cryptococcus neoformans/gattii NOT DETECTED NOT DETECTED Final   Methicillin resistance mecA/C NOT DETECTED NOT DETECTED Final    Comment: Performed at [facility], [address] [place], [place] [address]       Radiology Studies: DG Chest Port 1 View  Result Date: 11/08/2019 CLINICAL DATA:  Hypoxia with COVID. Increased shortness of breath. Cough and fever. EXAM: PORTABLE CHEST 1 VIEW COMPARISON:  05/11/2016 FINDINGS: Post median sternotomy. Mild cardiomegaly with unchanged mediastinal contours. Vague patchy opacities in the periphery of both mid lower lung zones. No pleural fluid or pneumothorax. Chronic change of both shoulders. IMPRESSION: Vague patchy opacities in the periphery of both mid lower lung zones, suspicious for multifocal pneumonia, pattern typical of COVID-19. Electronically Signed   By: Keith Rake M.D.   On: 11/08/2019 23:54    Marzetta Board, MD, PhD [facility]  Between 7 am - 7 pm I am available, please contact me via Amion or Securechat  Between 7 pm - 7 am I am not available, please contact night coverage MD/APP via Amion

## 2019-11-10 NOTE — ED Notes (Signed)
Pt used bedside commode.  Sats dropped to 88% on 3L, but recovered after 5 min.

## 2019-11-10 NOTE — ED Notes (Signed)
Per pharmacy they do not have in stock the specific eye drops that Pt is needing. This RN notified Pt and suggested that his daughter bring med from home. Pt voiced understanding, states he will contact daughter in AM to bring medication.

## 2019-11-11 ENCOUNTER — Inpatient Hospital Stay (HOSPITAL_COMMUNITY): Payer: Medicare Other

## 2019-11-11 LAB — GLUCOSE, CAPILLARY
Glucose-Capillary: 304 mg/dL — ABNORMAL HIGH (ref 70–99)
Glucose-Capillary: 369 mg/dL — ABNORMAL HIGH (ref 70–99)

## 2019-11-11 LAB — COMPREHENSIVE METABOLIC PANEL
ALT: 44 U/L (ref 0–44)
AST: 34 U/L (ref 15–41)
Albumin: 3 g/dL — ABNORMAL LOW (ref 3.5–5.0)
Alkaline Phosphatase: 61 U/L (ref 38–126)
Anion gap: 10 (ref 5–15)
BUN: 36 mg/dL — ABNORMAL HIGH (ref 8–23)
CO2: 22 mmol/L (ref 22–32)
Calcium: 8.7 mg/dL — ABNORMAL LOW (ref 8.9–10.3)
Chloride: 109 mmol/L (ref 98–111)
Creatinine, Ser: 1.21 mg/dL (ref 0.61–1.24)
GFR calc Af Amer: >60 mL/min (ref >60)
GFR calc non Af Amer: 59 mL/min — ABNORMAL LOW (ref >60)
Glucose, Bld: 259 mg/dL — ABNORMAL HIGH (ref 70–99)
Potassium: 5.2 mmol/L — ABNORMAL HIGH (ref 3.5–5.1)
Sodium: 141 mmol/L (ref 135–145)
Total Bilirubin: 0.4 mg/dL (ref 0.3–1.2)
Total Protein: 6.5 g/dL (ref 6.5–8.1)

## 2019-11-11 LAB — CBC WITH DIFFERENTIAL/PLATELET
Abs Immature Granulocytes: 0.03 10*3/uL (ref 0.00–0.07)
Basophils Absolute: 0 10*3/uL (ref 0.0–0.1)
Basophils Relative: 0 %
Eosinophils Absolute: 0 10*3/uL (ref 0.0–0.5)
Eosinophils Relative: 0 %
HCT: 37.7 % — ABNORMAL LOW (ref 39.0–52.0)
Hemoglobin: 12.6 g/dL — ABNORMAL LOW (ref 13.0–17.0)
Immature Granulocytes: 0 %
Lymphocytes Relative: 10 %
Lymphs Abs: 0.9 10*3/uL (ref 0.7–4.0)
MCH: 30 pg (ref 26.0–34.0)
MCHC: 33.4 g/dL (ref 30.0–36.0)
MCV: 89.8 fL (ref 80.0–100.0)
Monocytes Absolute: 0.6 10*3/uL (ref 0.1–1.0)
Monocytes Relative: 6 %
Neutro Abs: 8 10*3/uL — ABNORMAL HIGH (ref 1.7–7.7)
Neutrophils Relative %: 84 %
Platelets: 261 10*3/uL (ref 150–400)
RBC: 4.2 MIL/uL — ABNORMAL LOW (ref 4.22–5.81)
RDW: 12.4 % (ref 11.5–15.5)
WBC: 9.5 10*3/uL (ref 4.0–10.5)
nRBC: 0 % (ref 0.0–0.2)

## 2019-11-11 LAB — CBG MONITORING, ED
Glucose-Capillary: 272 mg/dL — ABNORMAL HIGH (ref 70–99)
Glucose-Capillary: 313 mg/dL — ABNORMAL HIGH (ref 70–99)

## 2019-11-11 LAB — C-REACTIVE PROTEIN: CRP: 1.8 mg/dL — ABNORMAL HIGH (ref <1.0)

## 2019-11-11 LAB — D-DIMER, QUANTITATIVE: D-Dimer, Quant: 1.67 ug/mL-FEU — ABNORMAL HIGH (ref 0.00–0.50)

## 2019-11-11 MED ORDER — IOHEXOL 350 MG/ML SOLN
75.0000 mL | Freq: Once | INTRAVENOUS | Status: AC | PRN
  Administered 2019-11-11: 75 mL via INTRAVENOUS

## 2019-11-11 MED ORDER — INSULIN DETEMIR 100 UNIT/ML ~~LOC~~ SOLN
7.0000 [IU] | Freq: Two times a day (BID) | SUBCUTANEOUS | Status: DC
  Administered 2019-11-11 – 2019-11-12 (×3): 7 [IU] via SUBCUTANEOUS
  Filled 2019-11-11 (×4): qty 0.07

## 2019-11-11 NOTE — Progress Notes (Signed)
Mathew Clements is a 74 y.o. male patient admitted from ED awake, alert - oriented  X 4 - no acute distress noted.  VSS - Blood pressure (!) 145/81, pulse 94, temperature 98.1 F (36.7 C), temperature source Oral, resp. rate 18, weight 91.6 kg, SpO2 90 %.    IV in place, occlusive dsg intact without redness.  Orientation to room, and floor completed with information packet given to patient. Patient declined safety video at this time.  Admission INP armband ID verified with patient and in place.   Patient is in bed, call light, phone and bedside table within reach, patient able to voice, and demonstrate understanding.  Skin, clean-dry- intact without evidence of bruising, or skin tears.   No evidence of skin break down noted on exam.    Dene Gentry, RN 11/11/2019 5:53 PM

## 2019-11-11 NOTE — ED Notes (Signed)
Report attempted, 5W unable to take at this time

## 2019-11-11 NOTE — ED Notes (Signed)
Report attempted, 5W RN at lunch, requested to talk to 5W charge, this RN was told charge is unable to take report at this time.

## 2019-11-11 NOTE — ED Notes (Signed)
Admitting MD notified on patient's elevated d- dimer result.

## 2019-11-11 NOTE — Progress Notes (Signed)
PROGRESS NOTE  Mathew Clements DTO:[id number] DOB: 10-21-1945 DOA: 11/08/2019 PCP: Caren Macadam, MD   LOS: 2 days   Brief Narrative / Interim history: 74 year old male with history of CAD with CABG, type 2 diabetes mellitus, HTN, HLD who came to the hospital with shortness of breath, chest pain and fevers.  He tested positive for COVID-19 approximately 5 days ago.  He reports a week history of chills, cough, shortness of breath, chest tightness and diarrhea.  Did have nausea but no vomiting.  Has not been vaccinated against Covid.  In the ED he was febrile to 100.2, chest x-ray showed bilateral pneumonia and required 4 L of supplemental oxygen.  SARS-CoV-2 came back positive.  His initial EKG was concerning for STEMI and cardiology has been consulted.  Subjective / 24h Interval events: States that he is feeling improved.  Better with ambulation.  No chest pain.  No abdominal pain, no nausea or vomiting.  Overall much better than 3 days ago  Assessment & Plan:  Principal Problem Acute Hypoxic Respiratory Failure due to Covid-19 Viral Illness -Started on remdesivir, continue, total of 5 days, today day 3 with end date on Tuesday, 8/24 -Started on steroids, continue -Started on baricitinib, continue.  LFTs are minimally elevated -OK with mild hypoxemia, goal at rest is > 85% SaO2 -encouraged the patient to sit up in chair in the daytime use I-S and flutter valve for pulmonary toiletry and then prone in bed when at night.    COVID-19 Labs  Recent Labs    11/08/19 2212 11/10/19 0337 11/11/19 0400  DDIMER 1.51* 1.01* 1.67*  FERRITIN 2,014*  --   --   LDH 296*  --   --   CRP 6.0* 4.9* 1.8*    Lab Results  Component Value Date   SARSCOV2NAA POSITIVE (A) 11/08/2019   [place] Not Detected 01/23/2019    Active Problems Chest pain, abnormal EKG with known CAD status post CABG-patient still with mild chest tightness this morning especially worse with coughing, this is likely due to  his pneumonia.  His high-sensitivity troponin is essentially negative.  Has been seen by cardiology.  No chest pain   Acute kidney injury-resolved with fluids, creatinine stable  Type 2 diabetes mellitus-continue to monitor CBGs in the setting of steroid use.  He is on sliding scale, persistently high CBGs, will add Levemir today  CBG (last 3)  Recent Labs    11/10/19 1803 11/10/19 2206 11/11/19 0816  GLUCAP 262* 292* 272*    Scheduled Meds: . vitamin C  500 mg Oral Daily  . baricitinib  2 mg Oral Daily  . cholecalciferol  1,000 Units Oral Daily  . heparin  5,000 Units Subcutaneous Q8H  . insulin aspart  0-5 Units Subcutaneous QHS  . insulin aspart  0-9 Units Subcutaneous TID WC  . insulin detemir  7 Units Subcutaneous BID  . methylPREDNISolone (SOLU-MEDROL) injection  0.5 mg/kg Intravenous Q12H  . Netarsudil-Latanoprost  1 drop Both Eyes Daily  . zinc sulfate  220 mg Oral Daily   Continuous Infusions: . sodium chloride Stopped (11/10/19 0700)  . remdesivir 100 mg in NS 100 mL 100 mg (11/11/19 1042)   PRN Meds:.acetaminophen, chlorpheniramine-HYDROcodone, guaiFENesin-dextromethorphan, loperamide  DVT prophylaxis: heparin Code Status: Full code Family Communication: d/w daughter over the phone  Status is: Inpatient  Remains inpatient appropriate because:Inpatient level of care appropriate due to severity of illness   Dispo: The patient is from: Home  Anticipated d/c is to: Home              Anticipated d/c date is: 3 days              Patient currently is not medically stable to d/c.  Consultants:  Cardiology    Procedures:  none  Microbiology: none  Antibacterials: none   Objective: Vitals:   11/11/19 0930 11/11/19 1000 11/11/19 1030 11/11/19 1047  BP: 120/61 117/64 122/66   Pulse: 78 71 80   Resp: (!) 26 (!) 25 (!) 24   Temp:    98.2 F (36.8 C)  TempSrc:    Oral  SpO2: 92% 94% 93% 90%  Weight:        Intake/Output Summary (Last 24  hours) at 11/11/2019 1050 Last data filed at 11/11/2019 0847 Gross per 24 hour  Intake --  Output 1000 ml  Net -1000 ml   Filed Weights   11/09/19 0200  Weight: 91.6 kg    Examination:  Constitutional: No distress Eyes: No icterus ENMT: Moist mucous membranes Neck: normal, supple Respiratory: Bibasilar rhonchi, no wheezing, no crackles, tachypneic Cardiovascular: Regular rate and rhythm, no murmurs, no edema, good pulses Abdomen: Soft, nontender, nondistended, bowel sounds positive Musculoskeletal: no clubbing / cyanosis.  Skin: No rashes appreciated Neurologic: Nonfocal, equal strength   Data Reviewed: I have independently reviewed following labs and imaging studies   CBC: Recent Labs  Lab 11/08/19 2204 11/08/19 2226 11/10/19 0337 11/11/19 0400  WBC 6.6  --  5.3 9.5  NEUTROABS 4.7  --  3.7 8.0*  HGB 12.9* 13.3 11.8* 12.6*  HCT 39.2 39.0 35.9* 37.7*  MCV 89.5  --  90.0 89.8  PLT 173  --  194 144   Basic Metabolic Panel: Recent Labs  Lab 11/08/19 2204 11/08/19 2226 11/09/19 1406 11/10/19 0337 11/11/19 0400  NA 137 141 142 141 141  K 4.4 4.6 4.6 4.5 5.2*  CL 109 110 112* 112* 109  CO2 19*  --  19* 18* 22  GLUCOSE 230* 235* 252* 202* 259*  BUN 26* 30* 26* 29* 36*  CREATININE 1.59* 1.50* 1.31* 1.21 1.21  CALCIUM 8.9  --  8.4* 8.4* 8.7*   GFR: Estimated Creatinine Clearance: 62.8 mL/min (by C-G formula based on SCr of 1.21 mg/dL). Liver Function Tests: Recent Labs  Lab 11/08/19 2204 11/10/19 0337 11/11/19 0400  AST 49* 50* 34  ALT 43 47* 44  ALKPHOS 59 57 61  BILITOT 0.7 0.7 0.4  PROT 7.0 6.4* 6.5  ALBUMIN 3.4* 2.8* 3.0*   No results for input(s): LIPASE, AMYLASE in the last 168 hours. No results for input(s): AMMONIA in the last 168 hours. Coagulation Profile: Recent Labs  Lab 11/08/19 2204  INR 1.1   Cardiac Enzymes: No results for input(s): CKTOTAL, CKMB, CKMBINDEX, TROPONINI in the last 168 hours. BNP (last 3 results) No results for  input(s): PROBNP in the last 8760 hours. HbA1C: Recent Labs    11/08/19 2204  HGBA1C 7.3*   CBG: Recent Labs  Lab 11/10/19 0822 11/10/19 1441 11/10/19 1803 11/10/19 2206 11/11/19 0816  GLUCAP 225* 393* 262* 292* 272*   Lipid Profile: Recent Labs    11/08/19 2204 11/08/19 2212  CHOL 79  --   HDL 29*  --   LDLCALC 26  --   TRIG 122 128  CHOLHDL 2.7  --    Thyroid Function Tests: No results for input(s): TSH, T4TOTAL, FREET4, T3FREE, THYROIDAB in the last 72 hours. Anemia Panel:  Recent Labs    11/08/19 2212  FERRITIN 2,014*   Urine analysis:    Component Value Date/Time   COLORURINE YELLOW 08/01/2013 1845   APPEARANCEUR CLEAR 08/01/2013 1845   LABSPEC 1.026 08/01/2013 1845   PHURINE 5.0 08/01/2013 1845   GLUCOSEU NEGATIVE 08/01/2013 1845   HGBUR NEGATIVE 08/01/2013 1845   BILIRUBINUR SMALL (A) 08/01/2013 1845   KETONESUR NEGATIVE 08/01/2013 1845   PROTEINUR NEGATIVE 08/01/2013 1845   UROBILINOGEN 0.2 08/01/2013 1845   NITRITE NEGATIVE 08/01/2013 1845   LEUKOCYTESUR NEGATIVE 08/01/2013 1845   Sepsis Labs: Invalid input(s): PROCALCITONIN, LACTICIDVEN  Recent Results (from the past 240 hour(s))  SARS Coronavirus 2 by RT PCR (hospital order, performed in [facility] hospital lab) Nasopharyngeal Nasopharyngeal Swab     Status: Abnormal   Collection Time: 11/08/19 10:31 PM   Specimen: Nasopharyngeal Swab  Result Value Ref Range Status   SARS Coronavirus 2 POSITIVE (A) NEGATIVE Final    Comment: RESULT CALLED TO, READ BACK BY AND VERIFIED WITH: A COSTALES RN 11/09/19 0015 JDW (NOTE) SARS-CoV-2 target nucleic acids are DETECTED  SARS-CoV-2 RNA is generally detectable in upper respiratory specimens  during the acute phase of infection.  Positive results are indicative  of the presence of the identified virus, but do not rule out bacterial infection or co-infection with other pathogens not detected by the test.  Clinical correlation with patient history and    other diagnostic information is necessary to determine patient infection status.  The expected result is negative.  Fact Sheet for Patients:   StrictlyIdeas.no   Fact Sheet for Healthcare Providers:   BankingDealers.co.za    This test is not yet approved or cleared by the [place] FDA and  has been authorized for detection and/or diagnosis of SARS-CoV-2 by FDA under an Emergency Use Authorization (EUA).  This EUA will remain in effect (meaning this test c an be used) for the duration of  the COVID-19 declaration under Section 564(b)(1) of the Act, 21 U.S.C. section 360-bbb-3(b)(1), unless the authorization is terminated or revoked sooner.  Performed at [facility], [place] [address] [place], [place] [address]   Blood Culture (routine x 2)     Status: None (Preliminary result)   Collection Time: 11/08/19 10:31 PM   Specimen: BLOOD  Result Value Ref Range Status   Specimen Description BLOOD SITE NOT SPECIFIED  Final   Special Requests   Final    BOTTLES DRAWN AEROBIC AND ANAEROBIC Blood Culture results may not be optimal due to an inadequate volume of blood received in culture bottles   Culture  Setup Time   Final    IN BOTH AEROBIC AND ANAEROBIC BOTTLES GRAM POSITIVE COCCI IN CHAINS GRAM POSITIVE COCCI IN CLUSTERS Organism ID to follow CRITICAL RESULT CALLED TO, READ BACK BY AND VERIFIED WITH: Alvira Monday [facility] 11/09/19 2333 JDW    Culture   Final    GRAM POSITIVE COCCI CULTURE REINCUBATED FOR BETTER GROWTH Performed at [facility], [place] [address] [place], [place] [address]    Report Status PENDING  Incomplete  Blood Culture ID Panel (Reflexed)     Status: Abnormal   Collection Time: 11/08/19 10:31 PM  Result Value Ref Range Status   Enterococcus faecalis NOT DETECTED NOT DETECTED Final   Enterococcus Faecium NOT DETECTED NOT DETECTED Final   Listeria monocytogenes NOT DETECTED NOT DETECTED Final    Staphylococcus species DETECTED (A) NOT DETECTED Final    Comment: CRITICAL RESULT CALLED TO, READ BACK  BY AND VERIFIED WITH: Alvira Monday [facility] 11/09/19 2333 JDW    Staphylococcus aureus (BCID) NOT DETECTED NOT DETECTED Final   Staphylococcus epidermidis DETECTED (A) NOT DETECTED Final    Comment: CRITICAL RESULT CALLED TO, READ BACK BY AND VERIFIED WITH: J Sharon PHARMD 11/09/19 2333 JDW    Staphylococcus lugdunensis NOT DETECTED NOT DETECTED Final   Streptococcus species DETECTED (A) NOT DETECTED Final    Comment: Not Enterococcus species, Streptococcus agalactiae, Streptococcus pyogenes, or Streptococcus pneumoniae. CRITICAL RESULT CALLED TO, READ BACK BY AND VERIFIED WITH: J [place] PHARMD 11/09/19 2333 JDW    Streptococcus agalactiae NOT DETECTED NOT DETECTED Final   Streptococcus pneumoniae NOT DETECTED NOT DETECTED Final   Streptococcus pyogenes NOT DETECTED NOT DETECTED Final   A.calcoaceticus-baumannii NOT DETECTED NOT DETECTED Final   Bacteroides fragilis NOT DETECTED NOT DETECTED Final   Enterobacterales NOT DETECTED NOT DETECTED Final   Enterobacter cloacae complex NOT DETECTED NOT DETECTED Final   Escherichia coli NOT DETECTED NOT DETECTED Final   Klebsiella aerogenes NOT DETECTED NOT DETECTED Final   Klebsiella oxytoca NOT DETECTED NOT DETECTED Final   Klebsiella pneumoniae NOT DETECTED NOT DETECTED Final   Proteus species NOT DETECTED NOT DETECTED Final   Salmonella species NOT DETECTED NOT DETECTED Final   Serratia marcescens NOT DETECTED NOT DETECTED Final   Haemophilus influenzae NOT DETECTED NOT DETECTED Final   Neisseria meningitidis NOT DETECTED NOT DETECTED Final   Pseudomonas aeruginosa NOT DETECTED NOT DETECTED Final   Stenotrophomonas maltophilia NOT DETECTED NOT DETECTED Final   Candida albicans NOT DETECTED NOT DETECTED Final   Candida auris NOT DETECTED NOT DETECTED Final   Candida glabrata NOT DETECTED NOT DETECTED Final   Candida krusei NOT DETECTED NOT  DETECTED Final   Candida parapsilosis NOT DETECTED NOT DETECTED Final   Candida tropicalis NOT DETECTED NOT DETECTED Final   Cryptococcus neoformans/gattii NOT DETECTED NOT DETECTED Final   Methicillin resistance mecA/C NOT DETECTED NOT DETECTED Final    Comment: Performed at [facility], [address], [place], [place] [address]  Blood Culture (routine x 2)     Status: None (Preliminary result)   Collection Time: 11/09/19 12:44 AM   Specimen: BLOOD LEFT HAND  Result Value Ref Range Status   Specimen Description BLOOD LEFT HAND  Final   Special Requests   Final    BOTTLES DRAWN AEROBIC AND ANAEROBIC Blood Culture adequate volume   Culture   Final    NO GROWTH 2 DAYS Performed at [facility], [place] [address], [place], [place] [address]    Report Status PENDING  Incomplete      Radiology Studies: CT ANGIO CHEST PE W OR WO CONTRAST  Result Date: 11/11/2019 CLINICAL DATA:  74 year old male with history of dyspnea. Elevated D-dimer. COVID infection. EXAM: CT ANGIOGRAPHY CHEST WITH CONTRAST TECHNIQUE: Multidetector CT imaging of the chest was performed using the standard protocol during bolus administration of intravenous contrast. Multiplanar CT image reconstructions and MIPs were obtained to evaluate the vascular anatomy. CONTRAST:  66mL OMNIPAQUE IOHEXOL 350 MG/ML SOLN COMPARISON:  No priors. FINDINGS: Cardiovascular: No filling defects within the pulmonary arterial tree to suggest underlying pulmonary embolism. Heart size is normal. There is no significant pericardial fluid, thickening or pericardial calcification. There is aortic atherosclerosis, as well as atherosclerosis of the great vessels of the mediastinum and the coronary arteries, including calcified atherosclerotic plaque in the left anterior descending, left circumflex and right coronary arteries. Status post median sternotomy for CABG including LIMA  to the LAD. Mild thickening calcification of the aortic valve.  Mediastinum/Nodes: No pathologically enlarged mediastinal or hilar lymph nodes. Esophagus is unremarkable in appearance. No axillary lymphadenopathy. Lungs/Pleura: Widespread patchy areas of peripheral predominant ground-glass attenuation with some thickening of the peribronchovascular interstitium and regional architectural distortion most evident throughout the mid to lower lungs bilaterally, compatible with multilobar pneumonia from reported COVID infection. No pleural effusions. No definite suspicious appearing pulmonary nodules or masses are noted. Upper Abdomen: Aortic atherosclerosis. Amorphous intermediate to high attenuation material lying dependently in the gallbladder likely to reflect biliary sludge or noncalcified gallstones. Gallbladder is incompletely imaged. Musculoskeletal: Median sternotomy wires. There are no aggressive appearing lytic or blastic lesions noted in the visualized portions of the skeleton. Review of the MIP images confirms the above findings. IMPRESSION: 1. No evidence of pulmonary embolism. 2. The appearance of the lungs is compatible with multilobar bilateral pneumonia from reported COVID infection. 3. Aortic atherosclerosis, in addition to three-vessel coronary artery disease. Status post median sternotomy for CABG including LIMA to the LAD. 4. There are calcifications of the aortic valve. Echocardiographic correlation for evaluation of potential valvular dysfunction may be warranted if clinically indicated. 5. Biliary sludge and/or small noncalcified gallstones lying dependently in the gallbladder. Aortic Atherosclerosis (ICD10-I70.0). Electronically Signed   By: Vinnie Langton M.D.   On: 11/11/2019 06:19    Marzetta Board, MD, PhD [facility]  Between 7 am - 7 pm I am available, please contact me via Amion or Securechat  Between 7 pm - 7 am I am not available, please contact night coverage MD/APP via Amion

## 2019-11-12 LAB — CBC WITH DIFFERENTIAL/PLATELET
Abs Immature Granulocytes: 0.1 10*3/uL — ABNORMAL HIGH (ref 0.00–0.07)
Basophils Absolute: 0 10*3/uL (ref 0.0–0.1)
Basophils Relative: 0 %
Eosinophils Absolute: 0 10*3/uL (ref 0.0–0.5)
Eosinophils Relative: 0 %
HCT: 34.8 % — ABNORMAL LOW (ref 39.0–52.0)
Hemoglobin: 11.8 g/dL — ABNORMAL LOW (ref 13.0–17.0)
Immature Granulocytes: 1 %
Lymphocytes Relative: 8 %
Lymphs Abs: 0.9 10*3/uL (ref 0.7–4.0)
MCH: 29.6 pg (ref 26.0–34.0)
MCHC: 33.9 g/dL (ref 30.0–36.0)
MCV: 87.2 fL (ref 80.0–100.0)
Monocytes Absolute: 0.8 10*3/uL (ref 0.1–1.0)
Monocytes Relative: 7 %
Neutro Abs: 9.9 10*3/uL — ABNORMAL HIGH (ref 1.7–7.7)
Neutrophils Relative %: 84 %
Platelets: 289 10*3/uL (ref 150–400)
RBC: 3.99 MIL/uL — ABNORMAL LOW (ref 4.22–5.81)
RDW: 12.5 % (ref 11.5–15.5)
WBC: 11.7 10*3/uL — ABNORMAL HIGH (ref 4.0–10.5)
nRBC: 0 % (ref 0.0–0.2)

## 2019-11-12 LAB — COMPREHENSIVE METABOLIC PANEL
ALT: 44 U/L (ref 0–44)
AST: 27 U/L (ref 15–41)
Albumin: 2.8 g/dL — ABNORMAL LOW (ref 3.5–5.0)
Alkaline Phosphatase: 60 U/L (ref 38–126)
Anion gap: 9 (ref 5–15)
BUN: 36 mg/dL — ABNORMAL HIGH (ref 8–23)
CO2: 23 mmol/L (ref 22–32)
Calcium: 8.2 mg/dL — ABNORMAL LOW (ref 8.9–10.3)
Chloride: 109 mmol/L (ref 98–111)
Creatinine, Ser: 1.17 mg/dL (ref 0.61–1.24)
GFR calc Af Amer: >60 mL/min (ref >60)
GFR calc non Af Amer: >60 mL/min (ref >60)
Glucose, Bld: 312 mg/dL — ABNORMAL HIGH (ref 70–99)
Potassium: 4.4 mmol/L (ref 3.5–5.1)
Sodium: 141 mmol/L (ref 135–145)
Total Bilirubin: 0.7 mg/dL (ref 0.3–1.2)
Total Protein: 6.1 g/dL — ABNORMAL LOW (ref 6.5–8.1)

## 2019-11-12 LAB — CULTURE, BLOOD (ROUTINE X 2)

## 2019-11-12 LAB — GLUCOSE, CAPILLARY
Glucose-Capillary: 246 mg/dL — ABNORMAL HIGH (ref 70–99)
Glucose-Capillary: 363 mg/dL — ABNORMAL HIGH (ref 70–99)
Glucose-Capillary: 451 mg/dL — ABNORMAL HIGH (ref 70–99)
Glucose-Capillary: 241 mg/dL — ABNORMAL HIGH (ref 70–99)

## 2019-11-12 LAB — C-REACTIVE PROTEIN: CRP: 0.9 mg/dL (ref <1.0)

## 2019-11-12 LAB — D-DIMER, QUANTITATIVE: D-Dimer, Quant: 0.75 ug/mL-FEU — ABNORMAL HIGH (ref 0.00–0.50)

## 2019-11-12 MED ORDER — BARICITINIB 2 MG PO TABS
4.0000 mg | ORAL_TABLET | Freq: Every day | ORAL | Status: DC
  Administered 2019-11-13: 4 mg via ORAL
  Filled 2019-11-12: qty 2

## 2019-11-12 MED ORDER — METOPROLOL TARTRATE 25 MG PO TABS
25.0000 mg | ORAL_TABLET | Freq: Two times a day (BID) | ORAL | Status: DC
  Administered 2019-11-12 – 2019-11-13 (×3): 25 mg via ORAL
  Filled 2019-11-12 (×3): qty 1

## 2019-11-12 MED ORDER — INSULIN ASPART 100 UNIT/ML ~~LOC~~ SOLN
6.0000 [IU] | Freq: Three times a day (TID) | SUBCUTANEOUS | Status: DC
  Administered 2019-11-12 – 2019-11-13 (×3): 6 [IU] via SUBCUTANEOUS

## 2019-11-12 MED ORDER — INSULIN ASPART 100 UNIT/ML ~~LOC~~ SOLN
4.0000 [IU] | Freq: Three times a day (TID) | SUBCUTANEOUS | Status: DC
  Administered 2019-11-12: 4 [IU] via SUBCUTANEOUS

## 2019-11-12 MED ORDER — INSULIN DETEMIR 100 UNIT/ML ~~LOC~~ SOLN
10.0000 [IU] | Freq: Two times a day (BID) | SUBCUTANEOUS | Status: DC
  Administered 2019-11-12 – 2019-11-13 (×2): 10 [IU] via SUBCUTANEOUS
  Filled 2019-11-12 (×3): qty 0.1

## 2019-11-12 NOTE — Progress Notes (Signed)
PROGRESS NOTE  Mathew Clements MGQ:[id number] DOB: October 28, 1945 DOA: 11/08/2019 PCP: Caren Macadam, MD   LOS: 3 days   Brief Narrative / Interim history: 74 year old male with history of CAD with CABG, type 2 diabetes mellitus, HTN, HLD who came to the hospital with shortness of breath, chest pain and fevers.  He tested positive for COVID-19 approximately 5 days ago.  He reports a week history of chills, cough, shortness of breath, chest tightness and diarrhea.  Did have nausea but no vomiting.  Has not been vaccinated against Covid.  In the ED he was febrile to 100.2, chest x-ray showed bilateral pneumonia and required 4 L of supplemental oxygen.  SARS-CoV-2 came back positive.  His initial EKG was concerning for STEMI and cardiology has been consulted.  Subjective / 24h Interval events: Feels like his energy is back and breathing is Much better.  No chest pain, no abdominal pain  Assessment & Plan:  Principal Problem Acute Hypoxic Respiratory Failure due to Covid-19 Viral Illness -Started on remdesivir, continue, total of 5 days, today day 4, with end date on Tuesday, 8/24 -Started on steroids, continue -Started on baricitinib, continue.  LFTs are minimally elevated -OK with mild hypoxemia, goal at rest is > 85% SaO2 -encouraged the patient to sit up in chair in the daytime use I-S and flutter valve for pulmonary toiletry and then prone in bed when at night.  -Looks good today, potentially home Thursday, may need home O2   COVID-19 Labs  Recent Labs    11/10/19 0337 11/11/19 0400 11/12/19 0419  DDIMER 1.01* 1.67* 0.75*  CRP 4.9* 1.8* 0.9    Lab Results  Component Value Date   SARSCOV2NAA POSITIVE (A) 11/08/2019   [place] Not Detected 01/23/2019    Active Problems Chest pain, abnormal EKG with known CAD status post CABG-patient still with mild chest tightness this morning especially worse with coughing, this is likely due to his pneumonia.  His high-sensitivity troponin is  essentially negative.  Has been seen by cardiology.  No chest pain   Acute kidney injury-resolved with fluids, creatinine stable  Type 2 diabetes mellitus with steroid-induced hyperglycemia-continue to monitor CBGs in the setting of steroid use.  Add scheduled mealtime, increase Levemir today, persistently hyperglycemic  CBG (last 3)  Recent Labs    11/11/19 1701 11/11/19 2104 11/12/19 0800  GLUCAP 304* 369* 246*    Scheduled Meds: . vitamin C  500 mg Oral Daily  . baricitinib  2 mg Oral Daily  . cholecalciferol  1,000 Units Oral Daily  . heparin  5,000 Units Subcutaneous Q8H  . insulin aspart  0-5 Units Subcutaneous QHS  . insulin aspart  0-9 Units Subcutaneous TID WC  . insulin detemir  7 Units Subcutaneous BID  . methylPREDNISolone (SOLU-MEDROL) injection  0.5 mg/kg Intravenous Q12H  . Netarsudil-Latanoprost  1 drop Both Eyes Daily  . zinc sulfate  220 mg Oral Daily   Continuous Infusions: . sodium chloride Stopped (11/10/19 0700)  . remdesivir 100 mg in NS 100 mL 100 mg (11/12/19 0940)   PRN Meds:.acetaminophen, chlorpheniramine-HYDROcodone, guaiFENesin-dextromethorphan, loperamide  DVT prophylaxis: heparin Code Status: Full code Family Communication: d/w daughter over the phone  Status is: Inpatient  Remains inpatient appropriate because:Inpatient level of care appropriate due to severity of illness   Dispo: The patient is from: Home              Anticipated d/c is to: Home  Anticipated d/c date is: 1 day              Patient currently is not medically stable to d/c.  Consultants:  Cardiology    Procedures:  none  Microbiology: none  Antibacterials: none   Objective: Vitals:   11/11/19 2026 11/12/19 0011 11/12/19 0319 11/12/19 0742  BP: 127/63 130/67 118/61   Pulse: 90 81 83   Resp: 18 (!) 25 20   Temp: 98 F (36.7 C) 97.9 F (36.6 C) 97.8 F (36.6 C) 98 F (36.7 C)  TempSrc: Oral Oral Oral Oral  SpO2: 100% 100% 97%   Weight:         Intake/Output Summary (Last 24 hours) at 11/12/2019 1015 Last data filed at 11/12/2019 0321 Gross per 24 hour  Intake 753.67 ml  Output 600 ml  Net 153.67 ml   Filed Weights   11/09/19 0200  Weight: 91.6 kg    Examination:  Constitutional: No distress, sitting at the edge of the bed Eyes: No scleral icterus ENMT: Moist extremities Neck: normal, supple Respiratory: Bibasilar rhonchi, improved, no wheezing or crackles Cardiovascular: Regular rate and rhythm, no edema Abdomen: Soft, NT, ND, bowel sounds positive Musculoskeletal: no clubbing / cyanosis.  Skin: No rashes seen Neurologic: Nonfocal, equal strength   Data Reviewed: I have independently reviewed following labs and imaging studies   CBC: Recent Labs  Lab 11/08/19 2204 11/08/19 2226 11/10/19 0337 11/11/19 0400 11/12/19 0419  WBC 6.6  --  5.3 9.5 11.7*  NEUTROABS 4.7  --  3.7 8.0* 9.9*  HGB 12.9* 13.3 11.8* 12.6* 11.8*  HCT 39.2 39.0 35.9* 37.7* 34.8*  MCV 89.5  --  90.0 89.8 87.2  PLT 173  --  194 261 542   Basic Metabolic Panel: Recent Labs  Lab 11/08/19 2204 11/08/19 2204 11/08/19 2226 11/09/19 1406 11/10/19 0337 11/11/19 0400 11/12/19 0419  NA 137   < > 141 142 141 141 141  K 4.4   < > 4.6 4.6 4.5 5.2* 4.4  CL 109   < > 110 112* 112* 109 109  CO2 19*  --   --  19* 18* 22 23  GLUCOSE 230*   < > 235* 252* 202* 259* 312*  BUN 26*   < > 30* 26* 29* 36* 36*  CREATININE 1.59*   < > 1.50* 1.31* 1.21 1.21 1.17  CALCIUM 8.9  --   --  8.4* 8.4* 8.7* 8.2*   < > = values in this interval not displayed.   GFR: Estimated Creatinine Clearance: 64.9 mL/min (by C-G formula based on SCr of 1.17 mg/dL). Liver Function Tests: Recent Labs  Lab 11/08/19 2204 11/10/19 0337 11/11/19 0400 11/12/19 0419  AST 49* 50* 34 27  ALT 43 47* 44 44  ALKPHOS 59 57 61 60  BILITOT 0.7 0.7 0.4 0.7  PROT 7.0 6.4* 6.5 6.1*  ALBUMIN 3.4* 2.8* 3.0* 2.8*   No results for input(s): LIPASE, AMYLASE in the last 168  hours. No results for input(s): AMMONIA in the last 168 hours. Coagulation Profile: Recent Labs  Lab 11/08/19 2204  INR 1.1   Cardiac Enzymes: No results for input(s): CKTOTAL, CKMB, CKMBINDEX, TROPONINI in the last 168 hours. BNP (last 3 results) No results for input(s): PROBNP in the last 8760 hours. HbA1C: No results for input(s): HGBA1C in the last 72 hours. CBG: Recent Labs  Lab 11/11/19 0816 11/11/19 1233 11/11/19 1701 11/11/19 2104 11/12/19 0800  GLUCAP 272* 313* 304* 369* 246*  Lipid Profile: No results for input(s): CHOL, HDL, LDLCALC, TRIG, CHOLHDL, LDLDIRECT in the last 72 hours. Thyroid Function Tests: No results for input(s): TSH, T4TOTAL, FREET4, T3FREE, THYROIDAB in the last 72 hours. Anemia Panel: No results for input(s): VITAMINB12, FOLATE, FERRITIN, TIBC, IRON, RETICCTPCT in the last 72 hours. Urine analysis:    Component Value Date/Time   COLORURINE YELLOW 08/01/2013 1845   APPEARANCEUR CLEAR 08/01/2013 1845   LABSPEC 1.026 08/01/2013 1845   PHURINE 5.0 08/01/2013 1845   GLUCOSEU NEGATIVE 08/01/2013 1845   HGBUR NEGATIVE 08/01/2013 1845   BILIRUBINUR SMALL (A) 08/01/2013 1845   KETONESUR NEGATIVE 08/01/2013 1845   PROTEINUR NEGATIVE 08/01/2013 1845   UROBILINOGEN 0.2 08/01/2013 1845   NITRITE NEGATIVE 08/01/2013 1845   LEUKOCYTESUR NEGATIVE 08/01/2013 1845   Sepsis Labs: Invalid input(s): PROCALCITONIN, LACTICIDVEN  Recent Results (from the past 240 hour(s))  SARS Coronavirus 2 by RT PCR (hospital order, performed in [facility] hospital lab) Nasopharyngeal Nasopharyngeal Swab     Status: Abnormal   Collection Time: 11/08/19 10:31 PM   Specimen: Nasopharyngeal Swab  Result Value Ref Range Status   SARS Coronavirus 2 POSITIVE (A) NEGATIVE Final    Comment: RESULT CALLED TO, READ BACK BY AND VERIFIED WITH: A COSTALES RN 11/09/19 0015 JDW (NOTE) SARS-CoV-2 target nucleic acids are DETECTED  SARS-CoV-2 RNA is generally detectable in upper  respiratory specimens  during the acute phase of infection.  Positive results are indicative  of the presence of the identified virus, but do not rule out bacterial infection or co-infection with other pathogens not detected by the test.  Clinical correlation with patient history and  other diagnostic information is necessary to determine patient infection status.  The expected result is negative.  Fact Sheet for Patients:   StrictlyIdeas.no   Fact Sheet for Healthcare Providers:   BankingDealers.co.za    This test is not yet approved or cleared by the [place] FDA and  has been authorized for detection and/or diagnosis of SARS-CoV-2 by FDA under an Emergency Use Authorization (EUA).  This EUA will remain in effect (meaning this test c an be used) for the duration of  the COVID-19 declaration under Section 564(b)(1) of the Act, 21 U.S.C. section 360-bbb-3(b)(1), unless the authorization is terminated or revoked sooner.  Performed at [facility], [place] [address], [place], [place] [address]   Blood Culture (routine x 2)     Status: Abnormal   Collection Time: 11/08/19 10:31 PM   Specimen: BLOOD  Result Value Ref Range Status   Specimen Description BLOOD SITE NOT SPECIFIED  Final   Special Requests   Final    BOTTLES DRAWN AEROBIC AND ANAEROBIC Blood Culture results may not be optimal due to an inadequate volume of blood received in culture bottles   Culture  Setup Time   Final    IN BOTH AEROBIC AND ANAEROBIC BOTTLES GRAM POSITIVE COCCI IN CHAINS GRAM POSITIVE COCCI IN CLUSTERS Organism ID to follow CRITICAL RESULT CALLED TO, READ BACK BY AND VERIFIED WITH: J [place] [facility] 11/09/19 2333 JDW    Culture (A)  Final    STAPHYLOCOCCUS EPIDERMIDIS STREPTOCOCCUS MITIS/ORALIS THE SIGNIFICANCE OF ISOLATING THIS ORGANISM FROM A SINGLE SET OF BLOOD CULTURES WHEN MULTIPLE SETS ARE DRAWN IS UNCERTAIN. PLEASE NOTIFY THE MICROBIOLOGY  DEPARTMENT WITHIN ONE WEEK IF SPECIATION AND SENSITIVITIES ARE REQUIRED. Performed at [facility], [place] [address], [place], [place] [address]    Report Status 11/12/2019 FINAL  Final  Blood Culture ID  Panel (Reflexed)     Status: Abnormal   Collection Time: 11/08/19 10:31 PM  Result Value Ref Range Status   Enterococcus faecalis NOT DETECTED NOT DETECTED Final   Enterococcus Faecium NOT DETECTED NOT DETECTED Final   Listeria monocytogenes NOT DETECTED NOT DETECTED Final   Staphylococcus species DETECTED (A) NOT DETECTED Final    Comment: CRITICAL RESULT CALLED TO, READ BACK BY AND VERIFIED WITH: J [place] PHARMD 11/09/19 2333 JDW    Staphylococcus aureus (BCID) NOT DETECTED NOT DETECTED Final   Staphylococcus epidermidis DETECTED (A) NOT DETECTED Final    Comment: CRITICAL RESULT CALLED TO, READ BACK BY AND VERIFIED WITH: J [place] PHARMD 11/09/19 2333 JDW    Staphylococcus lugdunensis NOT DETECTED NOT DETECTED Final   Streptococcus species DETECTED (A) NOT DETECTED Final    Comment: Not Enterococcus species, Streptococcus agalactiae, Streptococcus pyogenes, or Streptococcus pneumoniae. CRITICAL RESULT CALLED TO, READ BACK BY AND VERIFIED WITH: J [place] PHARMD 11/09/19 2333 JDW    Streptococcus agalactiae NOT DETECTED NOT DETECTED Final   Streptococcus pneumoniae NOT DETECTED NOT DETECTED Final   Streptococcus pyogenes NOT DETECTED NOT DETECTED Final   A.calcoaceticus-baumannii NOT DETECTED NOT DETECTED Final   Bacteroides fragilis NOT DETECTED NOT DETECTED Final   Enterobacterales NOT DETECTED NOT DETECTED Final   Enterobacter cloacae complex NOT DETECTED NOT DETECTED Final   Escherichia coli NOT DETECTED NOT DETECTED Final   Klebsiella aerogenes NOT DETECTED NOT DETECTED Final   Klebsiella oxytoca NOT DETECTED NOT DETECTED Final   Klebsiella pneumoniae NOT DETECTED NOT DETECTED Final   Proteus species NOT DETECTED NOT DETECTED Final   Salmonella species NOT DETECTED NOT  DETECTED Final   Serratia marcescens NOT DETECTED NOT DETECTED Final   Haemophilus influenzae NOT DETECTED NOT DETECTED Final   Neisseria meningitidis NOT DETECTED NOT DETECTED Final   Pseudomonas aeruginosa NOT DETECTED NOT DETECTED Final   Stenotrophomonas maltophilia NOT DETECTED NOT DETECTED Final   Candida albicans NOT DETECTED NOT DETECTED Final   Candida auris NOT DETECTED NOT DETECTED Final   Candida glabrata NOT DETECTED NOT DETECTED Final   Candida krusei NOT DETECTED NOT DETECTED Final   Candida parapsilosis NOT DETECTED NOT DETECTED Final   Candida tropicalis NOT DETECTED NOT DETECTED Final   Cryptococcus neoformans/gattii NOT DETECTED NOT DETECTED Final   Methicillin resistance mecA/C NOT DETECTED NOT DETECTED Final    Comment: Performed at [facility], [address], [place], [place] [address]  Blood Culture (routine x 2)     Status: None (Preliminary result)   Collection Time: 11/09/19 12:44 AM   Specimen: BLOOD LEFT HAND  Result Value Ref Range Status   Specimen Description BLOOD LEFT HAND  Final   Special Requests   Final    BOTTLES DRAWN AEROBIC AND ANAEROBIC Blood Culture adequate volume   Culture   Final    NO GROWTH 3 DAYS Performed at [facility], [place] [address] [place], [place] [address]    Report Status PENDING  Incomplete      Radiology Studies: CT ANGIO CHEST PE W OR WO CONTRAST  Result Date: 11/11/2019 CLINICAL DATA:  74 year old male with history of dyspnea. Elevated D-dimer. COVID infection. EXAM: CT ANGIOGRAPHY CHEST WITH CONTRAST TECHNIQUE: Multidetector CT imaging of the chest was performed using the standard protocol during bolus administration of intravenous contrast. Multiplanar CT image reconstructions and MIPs were obtained to evaluate the vascular anatomy. CONTRAST:  77mL OMNIPAQUE IOHEXOL 350 MG/ML SOLN COMPARISON:  No priors. FINDINGS: Cardiovascular: No filling  defects within the pulmonary arterial tree to suggest underlying  pulmonary embolism. Heart size is normal. There is no significant pericardial fluid, thickening or pericardial calcification. There is aortic atherosclerosis, as well as atherosclerosis of the great vessels of the mediastinum and the coronary arteries, including calcified atherosclerotic plaque in the left anterior descending, left circumflex and right coronary arteries. Status post median sternotomy for CABG including LIMA to the LAD. Mild thickening calcification of the aortic valve. Mediastinum/Nodes: No pathologically enlarged mediastinal or hilar lymph nodes. Esophagus is unremarkable in appearance. No axillary lymphadenopathy. Lungs/Pleura: Widespread patchy areas of peripheral predominant ground-glass attenuation with some thickening of the peribronchovascular interstitium and regional architectural distortion most evident throughout the mid to lower lungs bilaterally, compatible with multilobar pneumonia from reported COVID infection. No pleural effusions. No definite suspicious appearing pulmonary nodules or masses are noted. Upper Abdomen: Aortic atherosclerosis. Amorphous intermediate to high attenuation material lying dependently in the gallbladder likely to reflect biliary sludge or noncalcified gallstones. Gallbladder is incompletely imaged. Musculoskeletal: Median sternotomy wires. There are no aggressive appearing lytic or blastic lesions noted in the visualized portions of the skeleton. Review of the MIP images confirms the above findings. IMPRESSION: 1. No evidence of pulmonary embolism. 2. The appearance of the lungs is compatible with multilobar bilateral pneumonia from reported COVID infection. 3. Aortic atherosclerosis, in addition to three-vessel coronary artery disease. Status post median sternotomy for CABG including LIMA to the LAD. 4. There are calcifications of the aortic valve. Echocardiographic correlation for evaluation of potential valvular dysfunction may be warranted if clinically  indicated. 5. Biliary sludge and/or small noncalcified gallstones lying dependently in the gallbladder. Aortic Atherosclerosis (ICD10-I70.0). Electronically Signed   By: Vinnie Langton M.D.   On: 11/11/2019 06:19    Marzetta Board, MD, PhD [facility]  Between 7 am - 7 pm I am available, please contact me via Amion or Securechat  Between 7 pm - 7 am I am not available, please contact night coverage MD/APP via Amion

## 2019-11-12 NOTE — Plan of Care (Signed)
  Problem: Education: Goal: Knowledge of General Education information will improve Description: Including pain rating scale, medication(s)/side effects and non-pharmacologic comfort measures Outcome: Progressing   Problem: Health Behavior/Discharge Planning: Goal: Ability to manage health-related needs will improve Outcome: Progressing   Problem: Clinical Measurements: Goal: Ability to maintain clinical measurements within normal limits will improve Outcome: Progressing Goal: Will remain free from infection Outcome: Progressing Goal: Diagnostic test results will improve Outcome: Progressing Goal: Respiratory complications will improve Outcome: Progressing Goal: Cardiovascular complication will be avoided Outcome: Progressing   Problem: Activity: Goal: Risk for activity intolerance will decrease Outcome: Progressing   Problem: Nutrition: Goal: Adequate nutrition will be maintained Outcome: Progressing   Problem: Coping: Goal: Level of anxiety will decrease Outcome: Progressing   Problem: Elimination: Goal: Will not experience complications related to bowel motility Outcome: Progressing Goal: Will not experience complications related to urinary retention Outcome: Progressing   Problem: Pain Managment: Goal: General experience of comfort will improve Outcome: Progressing   Problem: Safety: Goal: Ability to remain free from injury will improve Outcome: Progressing   Problem: Skin Integrity: Goal: Risk for impaired skin integrity will decrease Outcome: Progressing   Problem: Education: Goal: Knowledge of risk factors and measures for prevention of condition will improve Outcome: Progressing   Problem: Respiratory: Goal: Will maintain a patent airway Outcome: Progressing   

## 2019-11-12 NOTE — Progress Notes (Signed)
SATURATION QUALIFICATIONS: (This note is used to comply with regulatory documentation for home oxygen)  Patient Saturations on Room Air at Rest = 88%  Patient Saturations on Room Air while Ambulating = 84%  Patient Saturations on 2 Liters of oxygen while Ambulating = 92%  Please briefly explain why patient needs home oxygen: Patient requiring O2 at this time

## 2019-11-12 NOTE — Progress Notes (Signed)
Inpatient Diabetes Program Recommendations  AACE/ADA: New Consensus Statement on Inpatient Glycemic Control (2015)  Target Ranges:  Prepandial:   less than 140 mg/dL      Peak postprandial:   less than 180 mg/dL (1-2 hours)      Critically ill patients:  140 - 180 mg/dL   Lab Results  Component Value Date   GLUCAP 246 (H) 11/12/2019   HGBA1C 7.3 (H) 11/08/2019    Review of Glycemic Control  Diabetes history: DM 2 Outpatient Diabetes medications: Glipizide 10 mg Daily, Metformin 1000 mg bid Current orders for Inpatient glycemic control:  Levemir 7 units bid Novolog 0-9 units tid + hs  Solumedrol 45.625 mg Q12 hours  Inpatient Diabetes Program Recommendations:    -  Levemir 12 units bid -  Novolog moderate 0-15 units -  Novolog 4 units tid meal coverage -  Tradjenta 5 mg Daily  Thanks,  Tama Headings RN, MSN, BC-ADM Inpatient Diabetes Coordinator Team Pager [phone] (8a-5p)

## 2019-11-13 LAB — COMPREHENSIVE METABOLIC PANEL
ALT: 39 U/L (ref 0–44)
AST: 22 U/L (ref 15–41)
Albumin: 3 g/dL — ABNORMAL LOW (ref 3.5–5.0)
Alkaline Phosphatase: 52 U/L (ref 38–126)
Anion gap: 11 (ref 5–15)
BUN: 33 mg/dL — ABNORMAL HIGH (ref 8–23)
CO2: 24 mmol/L (ref 22–32)
Calcium: 8.4 mg/dL — ABNORMAL LOW (ref 8.9–10.3)
Chloride: 108 mmol/L (ref 98–111)
Creatinine, Ser: 1.06 mg/dL (ref 0.61–1.24)
GFR calc Af Amer: >60 mL/min (ref >60)
GFR calc non Af Amer: >60 mL/min (ref >60)
Glucose, Bld: 234 mg/dL — ABNORMAL HIGH (ref 70–99)
Potassium: 4.4 mmol/L (ref 3.5–5.1)
Sodium: 143 mmol/L (ref 135–145)
Total Bilirubin: 1 mg/dL (ref 0.3–1.2)
Total Protein: 6.1 g/dL — ABNORMAL LOW (ref 6.5–8.1)

## 2019-11-13 LAB — CBC WITH DIFFERENTIAL/PLATELET
Abs Immature Granulocytes: 0.15 10*3/uL — ABNORMAL HIGH (ref 0.00–0.07)
Basophils Absolute: 0 10*3/uL (ref 0.0–0.1)
Basophils Relative: 0 %
Eosinophils Absolute: 0 10*3/uL (ref 0.0–0.5)
Eosinophils Relative: 0 %
HCT: 35.6 % — ABNORMAL LOW (ref 39.0–52.0)
Hemoglobin: 12.3 g/dL — ABNORMAL LOW (ref 13.0–17.0)
Immature Granulocytes: 1 %
Lymphocytes Relative: 6 %
Lymphs Abs: 0.9 10*3/uL (ref 0.7–4.0)
MCH: 30.3 pg (ref 26.0–34.0)
MCHC: 34.6 g/dL (ref 30.0–36.0)
MCV: 87.7 fL (ref 80.0–100.0)
Monocytes Absolute: 1.1 10*3/uL — ABNORMAL HIGH (ref 0.1–1.0)
Monocytes Relative: 7 %
Neutro Abs: 13.4 10*3/uL — ABNORMAL HIGH (ref 1.7–7.7)
Neutrophils Relative %: 86 %
Platelets: 297 10*3/uL (ref 150–400)
RBC: 4.06 MIL/uL — ABNORMAL LOW (ref 4.22–5.81)
RDW: 12.5 % (ref 11.5–15.5)
WBC: 15.5 10*3/uL — ABNORMAL HIGH (ref 4.0–10.5)
nRBC: 0 % (ref 0.0–0.2)

## 2019-11-13 LAB — D-DIMER, QUANTITATIVE: D-Dimer, Quant: 0.88 ug/mL-FEU — ABNORMAL HIGH (ref 0.00–0.50)

## 2019-11-13 LAB — GLUCOSE, CAPILLARY
Glucose-Capillary: 185 mg/dL — ABNORMAL HIGH (ref 70–99)
Glucose-Capillary: 314 mg/dL — ABNORMAL HIGH (ref 70–99)

## 2019-11-13 LAB — C-REACTIVE PROTEIN: CRP: 0.6 mg/dL (ref <1.0)

## 2019-11-13 MED ORDER — GUAIFENESIN-DM 100-10 MG/5ML PO SYRP: 10.0000 mL | ORAL_SOLUTION | ORAL | 0 refills | Status: AC | PRN

## 2019-11-13 MED ORDER — HYDROCOD POLST-CPM POLST ER 10-8 MG/5ML PO SUER
5.0000 mL | Freq: Two times a day (BID) | ORAL | 0 refills | Status: AC | PRN
Start: 2019-11-13 — End: ?

## 2019-11-13 MED ORDER — DEXAMETHASONE 6 MG PO TABS: 6.0000 mg | ORAL_TABLET | Freq: Every day | ORAL | 0 refills | Status: AC

## 2019-11-13 NOTE — Progress Notes (Signed)
Mathew Clements to be D/C'd home per MD order. Discussed with the patient and all questions fully answered.   VVS, Skin clean, dry and intact without evidence of skin break down, no evidence of skin tears noted.  IV catheter discontinued intact. Site without signs and symptoms of complications. Dressing and pressure applied.  An After Visit Summary was printed and given to the patient.  Patient escorted via [place], and D/C home via private auto.  Clois Dupes  11/13/2019 2:44 PM

## 2019-11-13 NOTE — Discharge Summary (Addendum)
Physician Discharge Summary  JERUSALEM WERT RJJ:[id number] DOB: [birth year] DOA: 11/08/2019  PCP: Caren Macadam, MD  Admit date: 11/08/2019 Discharge date: 11/13/2019  Admitted From: home Disposition:  home  Recommendations for Outpatient Follow-up:  1. Follow up with PCP in 1-2 weeks  Home Health: none Equipment/Devices: home O2  Discharge Condition: stable CODE STATUS: Full code Diet recommendation: diabetic   HPI: Per admitting MD, ABDIRAHIM FLAVELL is a 74 y.o. male with medical history significant of CAD status post CABG, noninsulin-dependent type II diabetes, hypertension, hyperlipidemia presenting to the ED for evaluation of shortness of breath, chest pain, and fever.  Patient recently tested positive for COVID-19 approximately 5 days ago.  Reports 1 week history of chills, cough, shortness of breath, chest tightness, and diarrhea.  He previously felt nauseous but did not vomit.  Nausea has now resolved.  Denies abdominal pain.  He has not been vaccinated against Covid.  States his chest tightness has now resolved after he received oxygen in the ED.  Hospital Course / Discharge diagnoses: Acute hypoxic respiratory failure due to COVID-19 pneumonia-patient was admitted to the hospital with pneumonia as well as hypoxia requiring supplemental oxygen.  He was initially on 5 L.  He received remdesivir, steroids along with baricitinib.  Clinically improved, completed 5 days of remdesivir, he feels back to baseline without any further shortness of breath, his energy has returned and is able to tolerate a regular diet.  He is ambulating the hallway without additional complaints.  He does require 2 L of oxygen on discharge based on ambulatory sats.  He will complete few additional days of steroids for a total of 10-day course. Sepsis ruled out Chest pain, abnormal EKG with known CAD status post CABG-patient still with mild chest tightness on admission especially worse with coughing, this is likely  due to his pneumonia. His high-sensitivity troponin is essentially negative. Has beenseen by cardiology.  No further chest pain episodes Acute kidney injury-resolved with fluids, creatinine stable Type 2 diabetes mellitus with steroid-induced hyperglycemia-we will have steroids for a few more days then anticipate CBGs will improve.  Hemoglobin A1c is 7.3 showing fair control as an outpatient  Discharge Instructions   Allergies as of 11/13/2019   No Known Allergies     Medication List    TAKE these medications   amLODipine 10 MG tablet Commonly known as: NORVASC Take 10 mg by mouth daily.   aspirin 81 MG EC tablet Take 81 mg by mouth daily. Swallow whole.   atorvastatin 40 MG tablet Commonly known as: LIPITOR Take 40 mg by mouth daily.   chlorpheniramine-HYDROcodone 10-8 MG/5ML Suer Commonly known as: TUSSIONEX Take 5 mLs by mouth every 12 (twelve) hours as needed for cough.   dexamethasone 6 MG tablet Commonly known as: DECADRON Take 1 tablet (6 mg total) by mouth daily.   glipiZIDE 10 MG 24 hr tablet Commonly known as: GLUCOTROL XL Take 10 mg by mouth daily with breakfast.   guaiFENesin-dextromethorphan 100-10 MG/5ML syrup Commonly known as: ROBITUSSIN DM Take 10 mLs by mouth every 4 (four) hours as needed for cough.   losartan 100 MG tablet Commonly known as: COZAAR Take 100 mg by mouth daily.   metFORMIN 1000 MG tablet Commonly known as: GLUCOPHAGE Take 1,000 mg by mouth 2 (two) times daily with a meal.   methocarbamol 750 MG tablet Commonly known as: ROBAXIN Take 750 mg by mouth as needed for muscle spasms.   metoprolol tartrate 25 MG tablet Commonly known as:  LOPRESSOR Take 25 mg by mouth 2 (two) times daily.   nitroGLYCERIN 0.4 MG SL tablet Commonly known as: NITROSTAT Place 1 tablet (0.4 mg total) under the tongue every 5 (five) minutes as needed for chest pain.   Rocklatan 0.02-0.005 % Soln Generic drug: Netarsudil-Latanoprost Place 1 drop into  both eyes daily.            Durable Medical Equipment  (From admission, onward)         Start     Ordered   11/13/19 1100  For home use only DME oxygen  Once       Question Answer Comment  Length of Need 6 Months   Mode or (Route) Nasal cannula   Liters per Minute 2   Frequency Continuous (stationary and portable oxygen unit needed)   Oxygen delivery system Gas      11/13/19 1059           Consultations:  None  Procedures/Studies:  CT ANGIO CHEST PE W OR WO CONTRAST  Result Date: 11/11/2019 CLINICAL DATA:  74 year old male with history of dyspnea. Elevated D-dimer. COVID infection. EXAM: CT ANGIOGRAPHY CHEST WITH CONTRAST TECHNIQUE: Multidetector CT imaging of the chest was performed using the standard protocol during bolus administration of intravenous contrast. Multiplanar CT image reconstructions and MIPs were obtained to evaluate the vascular anatomy. CONTRAST:  7mL OMNIPAQUE IOHEXOL 350 MG/ML SOLN COMPARISON:  No priors. FINDINGS: Cardiovascular: No filling defects within the pulmonary arterial tree to suggest underlying pulmonary embolism. Heart size is normal. There is no significant pericardial fluid, thickening or pericardial calcification. There is aortic atherosclerosis, as well as atherosclerosis of the great vessels of the mediastinum and the coronary arteries, including calcified atherosclerotic plaque in the left anterior descending, left circumflex and right coronary arteries. Status post median sternotomy for CABG including LIMA to the LAD. Mild thickening calcification of the aortic valve. Mediastinum/Nodes: No pathologically enlarged mediastinal or hilar lymph nodes. Esophagus is unremarkable in appearance. No axillary lymphadenopathy. Lungs/Pleura: Widespread patchy areas of peripheral predominant ground-glass attenuation with some thickening of the peribronchovascular interstitium and regional architectural distortion most evident throughout the mid to lower  lungs bilaterally, compatible with multilobar pneumonia from reported COVID infection. No pleural effusions. No definite suspicious appearing pulmonary nodules or masses are noted. Upper Abdomen: Aortic atherosclerosis. Amorphous intermediate to high attenuation material lying dependently in the gallbladder likely to reflect biliary sludge or noncalcified gallstones. Gallbladder is incompletely imaged. Musculoskeletal: Median sternotomy wires. There are no aggressive appearing lytic or blastic lesions noted in the visualized portions of the skeleton. Review of the MIP images confirms the above findings. IMPRESSION: 1. No evidence of pulmonary embolism. 2. The appearance of the lungs is compatible with multilobar bilateral pneumonia from reported COVID infection. 3. Aortic atherosclerosis, in addition to three-vessel coronary artery disease. Status post median sternotomy for CABG including LIMA to the LAD. 4. There are calcifications of the aortic valve. Echocardiographic correlation for evaluation of potential valvular dysfunction may be warranted if clinically indicated. 5. Biliary sludge and/or small noncalcified gallstones lying dependently in the gallbladder. Aortic Atherosclerosis (ICD10-I70.0). Electronically Signed   By: Vinnie Langton M.D.   On: 11/11/2019 06:19   DG Chest Port 1 View  Result Date: 11/08/2019 CLINICAL DATA:  Hypoxia with COVID. Increased shortness of breath. Cough and fever. EXAM: PORTABLE CHEST 1 VIEW COMPARISON:  05/11/2016 FINDINGS: Post median sternotomy. Mild cardiomegaly with unchanged mediastinal contours. Vague patchy opacities in the periphery of both mid lower lung zones. No pleural  fluid or pneumothorax. Chronic change of both shoulders. IMPRESSION: Vague patchy opacities in the periphery of both mid lower lung zones, suspicious for multifocal pneumonia, pattern typical of COVID-19. Electronically Signed   By: Keith Rake M.D.   On: 11/08/2019 23:54       Subjective: - no chest pain, shortness of breath, no abdominal pain, nausea or vomiting.   Discharge Exam: BP 124/63   Pulse 75   Temp 97.6 F (36.4 C) (Oral)   Resp (!) 23   Wt 91.6 kg   SpO2 92%   BMI 28.98 kg/m   General: Pt is alert, awake, not in acute distress Cardiovascular: RRR, S1/S2 +, no rubs, no gallops Respiratory: CTA bilaterally, no wheezing, no rhonchi Abdominal: Soft, NT, ND, bowel sounds + Extremities: no edema, no cyanosis    The results of significant diagnostics from this hospitalization (including imaging, microbiology, ancillary and laboratory) are listed below for reference.     Microbiology: Recent Results (from the past 240 hour(s))  SARS Coronavirus 2 by RT PCR (hospital order, performed in [facility] hospital lab) Nasopharyngeal Nasopharyngeal Swab     Status: Abnormal   Collection Time: 11/08/19 10:31 PM   Specimen: Nasopharyngeal Swab  Result Value Ref Range Status   SARS Coronavirus 2 POSITIVE (A) NEGATIVE Final    Comment: RESULT CALLED TO, READ BACK BY AND VERIFIED WITH: A COSTALES RN 11/09/19 0015 JDW (NOTE) SARS-CoV-2 target nucleic acids are DETECTED  SARS-CoV-2 RNA is generally detectable in upper respiratory specimens  during the acute phase of infection.  Positive results are indicative  of the presence of the identified virus, but do not rule out bacterial infection or co-infection with other pathogens not detected by the test.  Clinical correlation with patient history and  other diagnostic information is necessary to determine patient infection status.  The expected result is negative.  Fact Sheet for Patients:   StrictlyIdeas.no   Fact Sheet for Healthcare Providers:   BankingDealers.co.za    This test is not yet approved or cleared by the [place] FDA and  has been authorized for detection and/or diagnosis of SARS-CoV-2 by FDA under an Emergency Use  Authorization (EUA).  This EUA will remain in effect (meaning this test c an be used) for the duration of  the COVID-19 declaration under Section 564(b)(1) of the Act, 21 U.S.C. section 360-bbb-3(b)(1), unless the authorization is terminated or revoked sooner.  Performed at [facility], [place] [address] [place], [place] [address]   Blood Culture (routine x 2)     Status: Abnormal   Collection Time: 11/08/19 10:31 PM   Specimen: BLOOD  Result Value Ref Range Status   Specimen Description BLOOD SITE NOT SPECIFIED  Final   Special Requests   Final    BOTTLES DRAWN AEROBIC AND ANAEROBIC Blood Culture results may not be optimal due to an inadequate volume of blood received in culture bottles   Culture  Setup Time   Final    IN BOTH AEROBIC AND ANAEROBIC BOTTLES GRAM POSITIVE COCCI IN CHAINS GRAM POSITIVE COCCI IN CLUSTERS Organism ID to follow CRITICAL RESULT CALLED TO, READ BACK BY AND VERIFIED WITH: J [facility] 11/09/19 2333 JDW    Culture (A)  Final    STAPHYLOCOCCUS EPIDERMIDIS STREPTOCOCCUS MITIS/ORALIS THE SIGNIFICANCE OF ISOLATING THIS ORGANISM FROM A SINGLE SET OF BLOOD CULTURES WHEN MULTIPLE SETS ARE DRAWN IS UNCERTAIN. PLEASE NOTIFY THE MICROBIOLOGY DEPARTMENT WITHIN ONE WEEK IF SPECIATION AND SENSITIVITIES ARE REQUIRED. Performed at [facility], [place] [address], [place], [place] [address]    Report Status 11/12/2019 FINAL  Final  Blood Culture ID Panel (Reflexed)     Status: Abnormal   Collection Time: 11/08/19 10:31 PM  Result Value Ref Range Status   Enterococcus faecalis NOT DETECTED NOT DETECTED Final   Enterococcus Faecium NOT DETECTED NOT DETECTED Final   Listeria monocytogenes NOT DETECTED NOT DETECTED Final   Staphylococcus species DETECTED (A) NOT DETECTED Final    Comment: CRITICAL RESULT CALLED TO, READ BACK BY AND VERIFIED WITH: J Paul PHARMD 11/09/19 2333 JDW    Staphylococcus aureus (BCID) NOT DETECTED NOT DETECTED Final   Staphylococcus  epidermidis DETECTED (A) NOT DETECTED Final    Comment: CRITICAL RESULT CALLED TO, READ BACK BY AND VERIFIED WITH: J [place] PHARMD 11/09/19 2333 JDW    Staphylococcus lugdunensis NOT DETECTED NOT DETECTED Final   Streptococcus species DETECTED (A) NOT DETECTED Final    Comment: Not Enterococcus species, Streptococcus agalactiae, Streptococcus pyogenes, or Streptococcus pneumoniae. CRITICAL RESULT CALLED TO, READ BACK BY AND VERIFIED WITH: J [place] PHARMD 11/09/19 2333 JDW    Streptococcus agalactiae NOT DETECTED NOT DETECTED Final   Streptococcus pneumoniae NOT DETECTED NOT DETECTED Final   Streptococcus pyogenes NOT DETECTED NOT DETECTED Final   A.calcoaceticus-baumannii NOT DETECTED NOT DETECTED Final   Bacteroides fragilis NOT DETECTED NOT DETECTED Final   Enterobacterales NOT DETECTED NOT DETECTED Final   Enterobacter cloacae complex NOT DETECTED NOT DETECTED Final   Escherichia coli NOT DETECTED NOT DETECTED Final   Klebsiella aerogenes NOT DETECTED NOT DETECTED Final   Klebsiella oxytoca NOT DETECTED NOT DETECTED Final   Klebsiella pneumoniae NOT DETECTED NOT DETECTED Final   Proteus species NOT DETECTED NOT DETECTED Final   Salmonella species NOT DETECTED NOT DETECTED Final   Serratia marcescens NOT DETECTED NOT DETECTED Final   Haemophilus influenzae NOT DETECTED NOT DETECTED Final   Neisseria meningitidis NOT DETECTED NOT DETECTED Final   Pseudomonas aeruginosa NOT DETECTED NOT DETECTED Final   Stenotrophomonas maltophilia NOT DETECTED NOT DETECTED Final   Candida albicans NOT DETECTED NOT DETECTED Final   Candida auris NOT DETECTED NOT DETECTED Final   Candida glabrata NOT DETECTED NOT DETECTED Final   Candida krusei NOT DETECTED NOT DETECTED Final   Candida parapsilosis NOT DETECTED NOT DETECTED Final   Candida tropicalis NOT DETECTED NOT DETECTED Final   Cryptococcus neoformans/gattii NOT DETECTED NOT DETECTED Final   Methicillin resistance mecA/C NOT DETECTED NOT  DETECTED Final    Comment: Performed at [facility], [address], [place], [place] [address]  Blood Culture (routine x 2)     Status: None (Preliminary result)   Collection Time: 11/09/19 12:44 AM   Specimen: BLOOD LEFT HAND  Result Value Ref Range Status   Specimen Description BLOOD LEFT HAND  Final   Special Requests   Final    BOTTLES DRAWN AEROBIC AND ANAEROBIC Blood Culture adequate volume   Culture   Final    NO GROWTH 4 DAYS Performed at [facility], [place] [address] [place], [place] [address]    Report Status PENDING  Incomplete     Labs: Basic Metabolic Panel: Recent Labs  Lab 11/09/19 1406 11/10/19 0337 11/11/19 0400 11/12/19 0419 11/13/19 0721  NA 142 141 141 141 143  K 4.6 4.5 5.2* 4.4 4.4  CL 112* 112* 109 109 108  CO2 19* 18* 22 23 24   GLUCOSE 252* 202* 259* 312* 234*  BUN 26* 29* 36*  36* 33*  CREATININE 1.31* 1.21 1.21 1.17 1.06  CALCIUM 8.4* 8.4* 8.7* 8.2* 8.4*   Liver Function Tests: Recent Labs  Lab 11/08/19 2204 11/10/19 0337 11/11/19 0400 11/12/19 0419 11/13/19 0721  AST 49* 50* 34 27 22  ALT 43 47* 44 44 39  ALKPHOS 59 57 61 60 52  BILITOT 0.7 0.7 0.4 0.7 1.0  PROT 7.0 6.4* 6.5 6.1* 6.1*  ALBUMIN 3.4* 2.8* 3.0* 2.8* 3.0*   CBC: Recent Labs  Lab 11/08/19 2204 11/08/19 2204 11/08/19 2226 11/10/19 0337 11/11/19 0400 11/12/19 0419 11/13/19 0721  WBC 6.6  --   --  5.3 9.5 11.7* 15.5*  NEUTROABS 4.7  --   --  3.7 8.0* 9.9* 13.4*  HGB 12.9*   < > 13.3 11.8* 12.6* 11.8* 12.3*  HCT 39.2   < > 39.0 35.9* 37.7* 34.8* 35.6*  MCV 89.5  --   --  90.0 89.8 87.2 87.7  PLT 173  --   --  194 261 289 297   < > = values in this interval not displayed.   CBG: Recent Labs  Lab 11/12/19 0800 11/12/19 1159 11/12/19 1655 11/12/19 2155 11/13/19 0749  GLUCAP 246* 363* 451* 241* 185*   Hgb A1c No results for input(s): HGBA1C in the last 72 hours. Lipid Profile No results for input(s): CHOL, HDL, LDLCALC, TRIG, CHOLHDL, LDLDIRECT  in the last 72 hours. Thyroid function studies No results for input(s): TSH, T4TOTAL, T3FREE, THYROIDAB in the last 72 hours.  Invalid input(s): FREET3 Urinalysis    Component Value Date/Time   COLORURINE YELLOW 08/01/2013 1845   APPEARANCEUR CLEAR 08/01/2013 1845   LABSPEC 1.026 08/01/2013 1845   PHURINE 5.0 08/01/2013 1845   GLUCOSEU NEGATIVE 08/01/2013 1845   HGBUR NEGATIVE 08/01/2013 1845   BILIRUBINUR SMALL (A) 08/01/2013 1845   KETONESUR NEGATIVE 08/01/2013 1845   PROTEINUR NEGATIVE 08/01/2013 1845   UROBILINOGEN 0.2 08/01/2013 1845   NITRITE NEGATIVE 08/01/2013 1845   LEUKOCYTESUR NEGATIVE 08/01/2013 1845    FURTHER DISCHARGE INSTRUCTIONS:   Get Medicines reviewed and adjusted: Please take all your medications with you for your next visit with your Primary MD   Laboratory/radiological data: Please request your Primary MD to go over all hospital tests and procedure/radiological results at the follow up, please ask your Primary MD to get all Hospital records sent to his/her office.   In some cases, they will be blood work, cultures and biopsy results pending at the time of your discharge. Please request that your primary care M.D. goes through all the records of your hospital data and follows up on these results.   Also Note the following: If you experience worsening of your admission symptoms, develop shortness of breath, life threatening emergency, suicidal or homicidal thoughts you must seek medical attention immediately by calling 911 or calling your MD immediately  if symptoms less severe.   You must read complete instructions/literature along with all the possible adverse reactions/side effects for all the Medicines you take and that have been prescribed to you. Take any new Medicines after you have completely understood and accpet all the possible adverse reactions/side effects.    Do not drive when taking Pain medications or sleeping medications (Benzodaizepines)    Do not take more than prescribed Pain, Sleep and Anxiety Medications. It is not advisable to combine anxiety,sleep and pain medications without talking with your primary care practitioner   Special Instructions: If you have smoked or chewed Tobacco  in the last 2 yrs please  stop smoking, stop any regular Alcohol  and or any Recreational drug use.   Wear Seat belts while driving.   Please note: You were cared for by a hospitalist during your hospital stay. Once you are discharged, your primary care physician will handle any further medical issues. Please note that NO REFILLS for any discharge medications will be authorized once you are discharged, as it is imperative that you return to your primary care physician (or establish a relationship with a primary care physician if you do not have one) for your post hospital discharge needs so that they can reassess your need for medications and monitor your lab values.  Time coordinating discharge: 35 minutes  SIGNED:  Marzetta Board, MD, PhD 11/13/2019, 11:06 AM

## 2019-11-13 NOTE — TOC Transition Note (Signed)
Transition of Care [facility]) - CM/SW Discharge Note   Patient Details  Name: Mathew Clements MRN: [id number] Date of Birth: Mar 16, 1946  Transition of [facility]) CM/SW Contact:  Carles Collet, RN Phone Number: 11/13/2019, 11:25 AM   Clinical Narrative:    Could not reach patient on the phone. Spoke w his daughter Mathew Clements. Patient will return to home today with home oxygen POC and home O2 explained to [place]. Referral placed to [employer]. POC will be delivered to the unit and home concentrator will be delivered to the house after DC. Mathew Clements states they will provide transportation.    Final next level of care: Home/Self Care Barriers to Discharge: No Barriers Identified   Patient Goals and CMS Choice        Discharge Placement                       Discharge Plan and Services                DME Arranged: Oxygen DME Agency: [employer] Date DME Agency Contacted: 11/13/19 Time DME Agency Contacted: [phone] Representative spoke with at DME Agency:  [place] Interventions     Readmission Risk Interventions No flowsheet data found.

## 2019-11-13 NOTE — Discharge Instructions (Signed)
COVID-19 COVID-19 is a respiratory infection that is caused by a virus called severe acute respiratory syndrome coronavirus 2 (SARS-CoV-2). The disease is also known as coronavirus disease or novel coronavirus. In some people, the virus may not cause any symptoms. In others, it may cause a serious infection. The infection can get worse quickly and can lead to complications, such as:  Pneumonia, or infection of the lungs.  Acute respiratory distress syndrome or ARDS. This is a condition in which fluid build-up in the lungs prevents the lungs from filling with air and passing oxygen into the blood.  Acute respiratory failure. This is a condition in which there is not enough oxygen passing from the lungs to the body or when carbon dioxide is not passing from the lungs out of the body.  Sepsis or septic shock. This is a serious bodily reaction to an infection.  Blood clotting problems.  Secondary infections due to bacteria or fungus.  Organ failure. This is when your body's organs stop working. The virus that causes COVID-19 is contagious. This means that it can spread from person to person through droplets from coughs and sneezes (respiratory secretions). What are the causes? This illness is caused by a virus. You may catch the virus by:  Breathing in droplets from an infected person. Droplets can be spread by a person breathing, speaking, singing, coughing, or sneezing.  Touching something, like a table or a doorknob, that was exposed to the virus (contaminated) and then touching your mouth, nose, or eyes. What increases the risk? Risk for infection You are more likely to be infected with this virus if you:  Are within 6 feet (2 meters) of a person with COVID-19.  Provide care for or live with a person who is infected with COVID-19.  Spend time in crowded indoor spaces or live in shared housing. Risk for serious illness You are more likely to become seriously ill from the virus if  you:  Are 50 years of age or older. The higher your age, the more you are at risk for serious illness.  Live in a nursing home or long-term care facility.  Have cancer.  Have a long-term (chronic) disease such as: ? Chronic lung disease, including chronic obstructive pulmonary disease or asthma. ? A long-term disease that lowers your body's ability to fight infection (immunocompromised). ? Heart disease, including heart failure, a condition in which the arteries that lead to the heart become narrow or blocked (coronary artery disease), a disease which makes the heart muscle thick, weak, or stiff (cardiomyopathy). ? Diabetes. ? Chronic kidney disease. ? Sickle cell disease, a condition in which red blood cells have an abnormal "sickle" shape. ? Liver disease.  Are obese. What are the signs or symptoms? Symptoms of this condition can range from mild to severe. Symptoms may appear any time from 2 to 14 days after being exposed to the virus. They include:  A fever or chills.  A cough.  Difficulty breathing.  Headaches, body aches, or muscle aches.  Runny or stuffy (congested) nose.  A sore throat.  New loss of taste or smell. Some people may also have stomach problems, such as nausea, vomiting, or diarrhea. Other people may not have any symptoms of COVID-19. How is this diagnosed? This condition may be diagnosed based on:  Your signs and symptoms, especially if: ? You live in an area with a COVID-19 outbreak. ? You recently traveled to or from an area where the virus is common. ? You   provide care for or live with a person who was diagnosed with COVID-19. ? You were exposed to a person who was diagnosed with COVID-19.  A physical exam.  Lab tests, which may include: ? Taking a sample of fluid from the back of your nose and throat (nasopharyngeal fluid), your nose, or your throat using a swab. ? A sample of mucus from your lungs (sputum). ? Blood tests.  Imaging tests,  which may include, X-rays, CT scan, or ultrasound. How is this treated? At present, there is no medicine to treat COVID-19. Medicines that treat other diseases are being used on a trial basis to see if they are effective against COVID-19. Your health care provider will talk with you about ways to treat your symptoms. For most people, the infection is mild and can be managed at home with rest, fluids, and over-the-counter medicines. Treatment for a serious infection usually takes places in a hospital intensive care unit (ICU). It may include one or more of the following treatments. These treatments are given until your symptoms improve.  Receiving fluids and medicines through an IV.  Supplemental oxygen. Extra oxygen is given through a tube in the nose, a face mask, or a hood.  Positioning you to lie on your stomach (prone position). This makes it easier for oxygen to get into the lungs.  Continuous positive airway pressure (CPAP) or bi-level positive airway pressure (BPAP) machine. This treatment uses mild air pressure to keep the airways open. A tube that is connected to a motor delivers oxygen to the body.  Ventilator. This treatment moves air into and out of the lungs by using a tube that is placed in your windpipe.  Tracheostomy. This is a procedure to create a hole in the neck so that a breathing tube can be inserted.  Extracorporeal membrane oxygenation (ECMO). This procedure gives the lungs a chance to recover by taking over the functions of the heart and lungs. It supplies oxygen to the body and removes carbon dioxide. Follow these instructions at home: Lifestyle  If you are sick, stay home except to get medical care. Your health care provider will tell you how long to stay home. Call your health care provider before you go for medical care.  Rest at home as told by your health care provider.  Do not use any products that contain nicotine or tobacco, such as cigarettes,  e-cigarettes, and chewing tobacco. If you need help quitting, ask your health care provider.  Return to your normal activities as told by your health care provider. Ask your health care provider what activities are safe for you. General instructions  Take over-the-counter and prescription medicines only as told by your health care provider.  Drink enough fluid to keep your urine pale yellow.  Keep all follow-up visits as told by your health care provider. This is important. How is this prevented?  There is no vaccine to help prevent COVID-19 infection. However, there are steps you can take to protect yourself and others from this virus. To protect yourself:   Do not travel to areas where COVID-19 is a risk. The areas where COVID-19 is reported change often. To identify high-risk areas and travel restrictions, check the CDC travel website: wwwnc.cdc.gov/travel/notices  If you live in, or must travel to, an area where COVID-19 is a risk, take precautions to avoid infection. ? Stay away from people who are sick. ? Wash your hands often with soap and water for 20 seconds. If soap and water   are not available, use an alcohol-based hand sanitizer. ? Avoid touching your mouth, face, eyes, or nose. ? Avoid going out in public, follow guidance from your state and local health authorities. ? If you must go out in public, wear a cloth face covering or face mask. Make sure your mask covers your nose and mouth. ? Avoid crowded indoor spaces. Stay at least 6 feet (2 meters) away from others. ? Disinfect objects and surfaces that are frequently touched every day. This may include:  Counters and tables.  Doorknobs and light switches.  Sinks and faucets.  Electronics, such as phones, remote controls, keyboards, computers, and tablets. To protect others: If you have symptoms of COVID-19, take steps to prevent the virus from spreading to others.  If you think you have a COVID-19 infection, contact  your health care provider right away. Tell your health care team that you think you may have a COVID-19 infection.  Stay home. Leave your house only to seek medical care. Do not use public transport.  Do not travel while you are sick.  Wash your hands often with soap and water for 20 seconds. If soap and water are not available, use alcohol-based hand sanitizer.  Stay away from other members of your household. Let healthy household members care for children and pets, if possible. If you have to care for children or pets, wash your hands often and wear a mask. If possible, stay in your own room, separate from others. Use a different bathroom.  Make sure that all people in your household wash their hands well and often.  Cough or sneeze into a tissue or your sleeve or elbow. Do not cough or sneeze into your hand or into the air.  Wear a cloth face covering or face mask. Make sure your mask covers your nose and mouth. Where to find more information  Centers for Disease Control and Prevention: www.cdc.gov/coronavirus/2019-ncov/index.html  World Health Organization: www.who.int/health-topics/coronavirus Contact a health care provider if:  You live in or have traveled to an area where COVID-19 is a risk and you have symptoms of the infection.  You have had contact with someone who has COVID-19 and you have symptoms of the infection. Get help right away if:  You have trouble breathing.  You have pain or pressure in your chest.  You have confusion.  You have bluish lips and fingernails.  You have difficulty waking from sleep.  You have symptoms that get worse. These symptoms may represent a serious problem that is an emergency. Do not wait to see if the symptoms will go away. Get medical help right away. Call your local emergency services (911 in the [place] Do not drive yourself to the hospital. Let the emergency medical personnel know if you think you have  COVID-19. Summary  COVID-19 is a respiratory infection that is caused by a virus. It is also known as coronavirus disease or novel coronavirus. It can cause serious infections, such as pneumonia, acute respiratory distress syndrome, acute respiratory failure, or sepsis.  The virus that causes COVID-19 is contagious. This means that it can spread from person to person through droplets from breathing, speaking, singing, coughing, or sneezing.  You are more likely to develop a serious illness if you are 50 years of age or older, have a weak immune system, live in a nursing home, or have chronic disease.  There is no medicine to treat COVID-19. Your health care provider will talk with you about ways to treat your symptoms.    Take steps to protect yourself and others from infection. Wash your hands often and disinfect objects and surfaces that are frequently touched every day. Stay away from people who are sick and wear a mask if you are sick. This information is not intended to replace advice given to you by your health care provider. Make sure you discuss any questions you have with your health care provider. Document Revised: 01/05/2019 Document Reviewed: 04/13/2018 Elsevier Patient Education  2020 [place].  COVID-19: How to Protect Yourself and Others Know how it spreads  There is currently no vaccine to prevent coronavirus disease 2019 (COVID-19).  The best way to prevent illness is to avoid being exposed to this virus.  The virus is thought to spread mainly from person-to-person. ? Between people who are in close contact with one another (within about 6 feet). ? Through respiratory droplets produced when an infected person coughs, sneezes or talks. ? These droplets can land in the mouths or noses of people who are nearby or possibly be inhaled into the lungs. ? COVID-19 may be spread by people who are not showing symptoms. Everyone should Clean your hands often  Wash your hands  often with soap and water for at least 20 seconds especially after you have been in a public place, or after blowing your nose, coughing, or sneezing.  If soap and water are not readily available, use a hand sanitizer that contains at least 60% alcohol. Cover all surfaces of your hands and rub them together until they feel dry.  Avoid touching your eyes, nose, and mouth with unwashed hands. Avoid close contact  Limit contact with others as much as possible.  Avoid close contact with people who are sick.  Put distance between yourself and other people. ? Remember that some people without symptoms may be able to spread virus. ? This is especially important for people who are at higher risk of getting very [employer].cy Cover your mouth and nose with a mask when around others  You could spread COVID-19 to others even if you do not feel sick.  Everyone should wear a mask in public settings and when around people not living in their household, especially when social distancing is difficult to maintain. ? Masks should not be placed on young children under age 92, anyone who has trouble breathing, or is unconscious, incapacitated or otherwise unable to remove the mask without assistance.  The mask is meant to protect other people in case you are infected.  Do NOT use a facemask meant for a Dietitian.  Continue to keep about 6 feet between yourself and others. The mask is not a substitute for social distancing. Cover coughs and sneezes  Always cover your mouth and nose with a tissue when you cough or sneeze or use the inside of your elbow.  Throw used tissues in the trash.  Immediately wash your hands with soap and water for at least 20 seconds. If soap and water are not readily available, clean your hands with a hand sanitizer that contains at least 60% alcohol. Clean and disinfect  Clean AND disinfect  frequently touched surfaces daily. This includes tables, doorknobs, light switches, countertops, handles, desks, phones, keyboards, toilets, faucets, and sinks. [email or web address]  If surfaces are dirty, clean them: Use detergent or soap and water prior to disinfection.  Then, use a household disinfectant. You can see a list of EPA-registered household disinfectants here. [email or web address].[email or web address] 11/22/2018 This information is not intended to replace advice given to you by  your health care provider. Make sure you discuss any questions you have with your health care provider. Document Revised: 11/30/2018 Document Reviewed: 09/28/2018 Elsevier Patient Education  [employer].

## 2019-11-14 LAB — CULTURE, BLOOD (ROUTINE X 2)
Culture: NO GROWTH
Special Requests: ADEQUATE

## 2019-12-26 ENCOUNTER — Other Ambulatory Visit: Payer: Self-pay | Admitting: Family Medicine

## 2019-12-26 ENCOUNTER — Ambulatory Visit
Admission: RE | Admit: 2019-12-26 | Discharge: 2019-12-26 | Disposition: A | Discharge status: Home / Self Care | Payer: Medicare Other | Source: Ambulatory Visit | Attending: Family Medicine | Admitting: Family Medicine

## 2019-12-26 DIAGNOSIS — R9389 Abnormal findings on diagnostic imaging of other specified body structures: Secondary | ICD-10-CM

## 2020-02-07 ENCOUNTER — Other Ambulatory Visit: Payer: Self-pay | Admitting: Family Medicine

## 2020-02-07 ENCOUNTER — Ambulatory Visit
Admission: RE | Admit: 2020-02-07 | Discharge: 2020-02-07 | Disposition: A | Discharge status: Home / Self Care | Payer: Medicare Other | Source: Ambulatory Visit | Attending: Family Medicine | Admitting: Family Medicine

## 2020-02-07 ENCOUNTER — Other Ambulatory Visit: Payer: Self-pay

## 2020-02-07 DIAGNOSIS — R9389 Abnormal findings on diagnostic imaging of other specified body structures: Secondary | ICD-10-CM

## 2020-04-03 DIAGNOSIS — I2510 Atherosclerotic heart disease of native coronary artery without angina pectoris: Secondary | ICD-10-CM | POA: Diagnosis not present

## 2020-04-03 DIAGNOSIS — H35033 Hypertensive retinopathy, bilateral: Secondary | ICD-10-CM | POA: Diagnosis not present

## 2020-04-03 DIAGNOSIS — I10 Essential (primary) hypertension: Secondary | ICD-10-CM | POA: Diagnosis not present

## 2020-04-03 DIAGNOSIS — E1169 Type 2 diabetes mellitus with other specified complication: Secondary | ICD-10-CM | POA: Diagnosis not present

## 2020-04-03 DIAGNOSIS — E782 Mixed hyperlipidemia: Secondary | ICD-10-CM | POA: Diagnosis not present

## 2020-04-04 ENCOUNTER — Other Ambulatory Visit: Payer: Self-pay | Admitting: Cardiology

## 2020-05-08 DIAGNOSIS — I10 Essential (primary) hypertension: Secondary | ICD-10-CM | POA: Diagnosis not present

## 2020-05-08 DIAGNOSIS — E782 Mixed hyperlipidemia: Secondary | ICD-10-CM | POA: Diagnosis not present

## 2020-05-08 DIAGNOSIS — E538 Deficiency of other specified B group vitamins: Secondary | ICD-10-CM | POA: Diagnosis not present

## 2020-05-08 DIAGNOSIS — E1169 Type 2 diabetes mellitus with other specified complication: Secondary | ICD-10-CM | POA: Diagnosis not present

## 2020-05-08 DIAGNOSIS — I2510 Atherosclerotic heart disease of native coronary artery without angina pectoris: Secondary | ICD-10-CM | POA: Diagnosis not present

## 2020-05-19 DIAGNOSIS — I2510 Atherosclerotic heart disease of native coronary artery without angina pectoris: Secondary | ICD-10-CM | POA: Diagnosis not present

## 2020-05-19 DIAGNOSIS — E1169 Type 2 diabetes mellitus with other specified complication: Secondary | ICD-10-CM | POA: Diagnosis not present

## 2020-05-19 DIAGNOSIS — E782 Mixed hyperlipidemia: Secondary | ICD-10-CM | POA: Diagnosis not present

## 2020-05-19 DIAGNOSIS — H35033 Hypertensive retinopathy, bilateral: Secondary | ICD-10-CM | POA: Diagnosis not present

## 2020-05-19 DIAGNOSIS — I10 Essential (primary) hypertension: Secondary | ICD-10-CM | POA: Diagnosis not present

## 2020-06-16 DIAGNOSIS — I2510 Atherosclerotic heart disease of native coronary artery without angina pectoris: Secondary | ICD-10-CM | POA: Diagnosis not present

## 2020-06-16 DIAGNOSIS — I10 Essential (primary) hypertension: Secondary | ICD-10-CM | POA: Diagnosis not present

## 2020-06-16 DIAGNOSIS — E782 Mixed hyperlipidemia: Secondary | ICD-10-CM | POA: Diagnosis not present

## 2020-06-16 DIAGNOSIS — E1169 Type 2 diabetes mellitus with other specified complication: Secondary | ICD-10-CM | POA: Diagnosis not present

## 2020-06-16 DIAGNOSIS — H35033 Hypertensive retinopathy, bilateral: Secondary | ICD-10-CM | POA: Diagnosis not present

## 2020-08-05 DIAGNOSIS — I10 Essential (primary) hypertension: Secondary | ICD-10-CM | POA: Diagnosis not present

## 2020-08-05 DIAGNOSIS — I2510 Atherosclerotic heart disease of native coronary artery without angina pectoris: Secondary | ICD-10-CM | POA: Diagnosis not present

## 2020-08-05 DIAGNOSIS — E1169 Type 2 diabetes mellitus with other specified complication: Secondary | ICD-10-CM | POA: Diagnosis not present

## 2020-08-05 DIAGNOSIS — E782 Mixed hyperlipidemia: Secondary | ICD-10-CM | POA: Diagnosis not present

## 2020-08-19 DIAGNOSIS — E119 Type 2 diabetes mellitus without complications: Secondary | ICD-10-CM | POA: Diagnosis not present

## 2020-08-19 DIAGNOSIS — H40023 Open angle with borderline findings, high risk, bilateral: Secondary | ICD-10-CM | POA: Diagnosis not present

## 2020-08-19 DIAGNOSIS — H2513 Age-related nuclear cataract, bilateral: Secondary | ICD-10-CM | POA: Diagnosis not present

## 2020-08-19 DIAGNOSIS — H40053 Ocular hypertension, bilateral: Secondary | ICD-10-CM | POA: Diagnosis not present

## 2020-08-19 DIAGNOSIS — H35033 Hypertensive retinopathy, bilateral: Secondary | ICD-10-CM | POA: Diagnosis not present

## 2020-08-21 ENCOUNTER — Encounter: Payer: Self-pay | Admitting: Podiatry

## 2020-08-21 ENCOUNTER — Other Ambulatory Visit: Payer: Self-pay

## 2020-08-21 ENCOUNTER — Ambulatory Visit: Payer: Medicare Other | Admitting: Podiatry

## 2020-08-21 DIAGNOSIS — M79674 Pain in right toe(s): Secondary | ICD-10-CM

## 2020-08-21 DIAGNOSIS — E1140 Type 2 diabetes mellitus with diabetic neuropathy, unspecified: Secondary | ICD-10-CM | POA: Diagnosis not present

## 2020-08-21 DIAGNOSIS — M2141 Flat foot [pes planus] (acquired), right foot: Secondary | ICD-10-CM

## 2020-08-21 DIAGNOSIS — M79675 Pain in left toe(s): Secondary | ICD-10-CM

## 2020-08-21 DIAGNOSIS — L84 Corns and callosities: Secondary | ICD-10-CM | POA: Diagnosis not present

## 2020-08-21 DIAGNOSIS — M2142 Flat foot [pes planus] (acquired), left foot: Secondary | ICD-10-CM

## 2020-08-21 DIAGNOSIS — B351 Tinea unguium: Secondary | ICD-10-CM | POA: Diagnosis not present

## 2020-08-21 NOTE — Patient Instructions (Addendum)
Diabetes Mellitus and Foot Care Foot care is an important part of your health, especially when you have diabetes. Diabetes may cause you to have problems because of poor blood flow (circulation) to your feet and legs, which can cause your skin to:  Become thinner and drier.  Break more easily.  Heal more slowly.  Peel and crack. You may also have nerve damage (neuropathy) in your legs and feet, causing decreased feeling in them. This means that you may not notice minor injuries to your feet that could lead to more serious problems. Noticing and addressing any potential problems early is the best way to prevent future foot problems. How to care for your feet Foot hygiene  Wash your feet daily with warm water and mild soap. Do not use hot water. Then, pat your feet and the areas between your toes until they are completely dry. Do not soak your feet as this can dry your skin.  Trim your toenails straight across. Do not dig under them or around the cuticle. File the edges of your nails with an emery board or nail file.  Apply a moisturizing lotion or petroleum jelly to the skin on your feet and to dry, brittle toenails. Use lotion that does not contain alcohol and is unscented. Do not apply lotion between your toes.   Shoes and socks  Wear clean socks or stockings every day. Make sure they are not too tight. Do not wear knee-high stockings since they may decrease blood flow to your legs.  Wear shoes that fit properly and have enough cushioning. Always look in your shoes before you put them on to be sure there are no objects inside.  To break in new shoes, wear them for just a few hours a day. This prevents injuries on your feet. Wounds, scrapes, corns, and calluses  Check your feet daily for blisters, cuts, bruises, sores, and redness. If you cannot see the bottom of your feet, use a mirror or ask someone for help.  Do not cut corns or calluses or try to remove them with medicine.  If you  find a minor scrape, cut, or break in the skin on your feet, keep it and the skin around it clean and dry. You may clean these areas with mild soap and water. Do not clean the area with peroxide, alcohol, or iodine.  If you have a wound, scrape, corn, or callus on your foot, look at it several times a day to make sure it is healing and not infected. Check for: ? Redness, swelling, or pain. ? Fluid or blood. ? Warmth. ? Pus or a bad smell.   General tips  Do not cross your legs. This may decrease blood flow to your feet.  Do not use heating pads or hot water bottles on your feet. They may burn your skin. If you have lost feeling in your feet or legs, you may not know this is happening until it is too late.  Protect your feet from hot and cold by wearing shoes, such as at the beach or on hot pavement.  Schedule a complete foot exam at least once a year (annually) or more often if you have foot problems. Report any cuts, sores, or bruises to your health care provider immediately. Where to find more information  American Diabetes Association: www.diabetes.org  Association of Diabetes Care & Education Specialists: www.diabeteseducator.org Contact a health care provider if:  You have a medical condition that increases your risk of infection and   you have any cuts, sores, or bruises on your feet.  You have an injury that is not healing.  You have redness on your legs or feet.  You feel burning or tingling in your legs or feet.  You have pain or cramps in your legs and feet.  Your legs or feet are numb.  Your feet always feel cold.  You have pain around any toenails. Get help right away if:  You have a wound, scrape, corn, or callus on your foot and: ? You have pain, swelling, or redness that gets worse. ? You have fluid or blood coming from the wound, scrape, corn, or callus. ? Your wound, scrape, corn, or callus feels warm to the touch. ? You have pus or a bad smell coming from  the wound, scrape, corn, or callus. ? You have a fever. ? You have a red line going up your leg. Summary  Check your feet every day for blisters, cuts, bruises, sores, and redness.  Apply a moisturizing lotion or petroleum jelly to the skin on your feet and to dry, brittle toenails.  Wear shoes that fit properly and have enough cushioning.  If you have foot problems, report any cuts, sores, or bruises to your health care provider immediately.  Schedule a complete foot exam at least once a year (annually) or more often if you have foot problems. This information is not intended to replace advice given to you by your health care provider. Make sure you discuss any questions you have with your health care provider. Document Revised: 09/27/2019 Document Reviewed: 09/27/2019 Elsevier Patient Education  2021 Elsevier Inc.  

## 2020-08-21 NOTE — Progress Notes (Signed)
  Subjective:  Patient ID: Mathew Clements, male    DOB: 1945/10/19,  MRN: [id number]  Chief Complaint  Patient presents with  . Callouses      (np) preulcerative callus/toenail fungus/decreased monofilament; interested in diabetic shoes  . Diabetes    A1C  7.3    75 y.o. male presents with the above complaint. History confirmed with patient.  He is referred for diabetic foot exam and is interested in diabetic shoes.  He had diabetes for many years.  It was uncontrolled one-point last A1c 7.3.  Objective:  Physical Exam: warm, good capillary refill, no trophic changes or ulcerative lesions and normal DP and PT pulses.  He has an abnormal monofilament exam with loss of protective sensation distally.  Onychomycosis x10 in all toenails with yellow dystrophic nails with subungual debris.  Preulcerative calluses medial hallux bilaterally Assessment:   1. Pain due to onychomycosis of toenails of both feet   2. Callus of foot   3. Controlled type 2 diabetes with neuropathy ([place])   4. Pes planus of both feet      Plan:  Patient was evaluated and treated and all questions answered.  Patient educated on diabetes. Discussed proper diabetic foot care and discussed risks and complications of disease. Educated patient in depth on reasons to return to the office immediately should he/she discover anything concerning or new on the feet. All questions answered. Discussed proper shoes as well.   All symptomatic hyperkeratoses were safely debrided with a sterile #15 blade to patient's level of comfort without incident. We discussed preventative and palliative care of these lesions including supportive and accommodative shoegear, padding, prefabricated and custom molded accommodative orthoses, use of a pumice stone and lotions/creams daily.  Discussed the etiology and treatment options for the condition in detail with the patient. Educated patient on the topical and oral treatment options for mycotic  nails. Recommended debridement of the nails today. Sharp and mechanical debridement performed of all painful and mycotic nails today. Nails debrided in length and thickness using a nail nipper to level of comfort. Discussed treatment options including appropriate shoe gear. Follow up as needed for painful nails.   Would benefit from multi density diabetic insoles.  Will be scheduled for fitting for these  Return in about 1 year (around 08/21/2021) for diabetic foot exam.

## 2020-08-25 ENCOUNTER — Ambulatory Visit (INDEPENDENT_AMBULATORY_CARE_PROVIDER_SITE_OTHER): Payer: Medicare Other | Admitting: Podiatry

## 2020-08-25 ENCOUNTER — Other Ambulatory Visit: Payer: Self-pay

## 2020-08-25 DIAGNOSIS — M2142 Flat foot [pes planus] (acquired), left foot: Secondary | ICD-10-CM

## 2020-08-25 DIAGNOSIS — L84 Corns and callosities: Secondary | ICD-10-CM

## 2020-08-25 DIAGNOSIS — M2141 Flat foot [pes planus] (acquired), right foot: Secondary | ICD-10-CM

## 2020-08-25 DIAGNOSIS — E1140 Type 2 diabetes mellitus with diabetic neuropathy, unspecified: Secondary | ICD-10-CM

## 2020-08-25 NOTE — Progress Notes (Signed)
Patient presented for foam casting for 3 pair custom diabetic shoe inserts. Patient is measured with a [place] Device to be a size 10 wide  Diabetic shoes are chosen from the [employer]. The shoes chosen are 1st choice-[id number] and second choice is V950  The patient will be contacted when the shoes and inserts are ready to be picked up

## 2020-09-04 DIAGNOSIS — E1169 Type 2 diabetes mellitus with other specified complication: Secondary | ICD-10-CM | POA: Diagnosis not present

## 2020-09-04 DIAGNOSIS — I10 Essential (primary) hypertension: Secondary | ICD-10-CM | POA: Diagnosis not present

## 2020-09-04 DIAGNOSIS — E782 Mixed hyperlipidemia: Secondary | ICD-10-CM | POA: Diagnosis not present

## 2020-10-08 DIAGNOSIS — I10 Essential (primary) hypertension: Secondary | ICD-10-CM | POA: Diagnosis not present

## 2020-10-08 DIAGNOSIS — I2510 Atherosclerotic heart disease of native coronary artery without angina pectoris: Secondary | ICD-10-CM | POA: Diagnosis not present

## 2020-10-08 DIAGNOSIS — H35033 Hypertensive retinopathy, bilateral: Secondary | ICD-10-CM | POA: Diagnosis not present

## 2020-10-08 DIAGNOSIS — E782 Mixed hyperlipidemia: Secondary | ICD-10-CM | POA: Diagnosis not present

## 2020-10-08 DIAGNOSIS — E1169 Type 2 diabetes mellitus with other specified complication: Secondary | ICD-10-CM | POA: Diagnosis not present

## 2020-10-16 ENCOUNTER — Telehealth: Payer: Self-pay | Admitting: Podiatry

## 2020-10-16 NOTE — Telephone Encounter (Signed)
Pts daughter patricia left message checking on pts diabetic shoes.  I returned call and apologized that there has been a delay due to manufacturer moving but they should be shipping soon and I would call when they come in to schedule pt.

## 2020-10-30 ENCOUNTER — Ambulatory Visit (INDEPENDENT_AMBULATORY_CARE_PROVIDER_SITE_OTHER): Payer: Medicare Other

## 2020-10-30 ENCOUNTER — Other Ambulatory Visit: Payer: Self-pay

## 2020-10-30 DIAGNOSIS — M2141 Flat foot [pes planus] (acquired), right foot: Secondary | ICD-10-CM | POA: Diagnosis not present

## 2020-10-30 DIAGNOSIS — M2142 Flat foot [pes planus] (acquired), left foot: Secondary | ICD-10-CM | POA: Diagnosis not present

## 2020-10-30 DIAGNOSIS — E1140 Type 2 diabetes mellitus with diabetic neuropathy, unspecified: Secondary | ICD-10-CM | POA: Diagnosis not present

## 2020-10-30 NOTE — Progress Notes (Signed)
Patient in office today to pick-up diabetic shoes. Shoes were tried on and patient was satisfied with the fit. Patient was educated on the break-in process and verbalized understanding. Advised patient to call the office with any questions, comments or concerns.

## 2020-11-04 DIAGNOSIS — I10 Essential (primary) hypertension: Secondary | ICD-10-CM | POA: Diagnosis not present

## 2020-11-04 DIAGNOSIS — E538 Deficiency of other specified B group vitamins: Secondary | ICD-10-CM | POA: Diagnosis not present

## 2020-11-04 DIAGNOSIS — Z0000 Encounter for general adult medical examination without abnormal findings: Secondary | ICD-10-CM | POA: Diagnosis not present

## 2020-11-04 DIAGNOSIS — E782 Mixed hyperlipidemia: Secondary | ICD-10-CM | POA: Diagnosis not present

## 2020-11-04 DIAGNOSIS — I2510 Atherosclerotic heart disease of native coronary artery without angina pectoris: Secondary | ICD-10-CM | POA: Diagnosis not present

## 2020-11-04 DIAGNOSIS — I739 Peripheral vascular disease, unspecified: Secondary | ICD-10-CM | POA: Diagnosis not present

## 2020-11-04 DIAGNOSIS — E1169 Type 2 diabetes mellitus with other specified complication: Secondary | ICD-10-CM | POA: Diagnosis not present

## 2021-01-11 DIAGNOSIS — E1169 Type 2 diabetes mellitus with other specified complication: Secondary | ICD-10-CM | POA: Diagnosis not present

## 2021-01-11 DIAGNOSIS — E782 Mixed hyperlipidemia: Secondary | ICD-10-CM | POA: Diagnosis not present

## 2021-01-11 DIAGNOSIS — H35033 Hypertensive retinopathy, bilateral: Secondary | ICD-10-CM | POA: Diagnosis not present

## 2021-01-11 DIAGNOSIS — I10 Essential (primary) hypertension: Secondary | ICD-10-CM | POA: Diagnosis not present

## 2021-01-11 DIAGNOSIS — I2510 Atherosclerotic heart disease of native coronary artery without angina pectoris: Secondary | ICD-10-CM | POA: Diagnosis not present

## 2021-04-16 DIAGNOSIS — E1169 Type 2 diabetes mellitus with other specified complication: Secondary | ICD-10-CM | POA: Diagnosis not present

## 2021-04-16 DIAGNOSIS — E782 Mixed hyperlipidemia: Secondary | ICD-10-CM | POA: Diagnosis not present

## 2021-04-16 DIAGNOSIS — I2510 Atherosclerotic heart disease of native coronary artery without angina pectoris: Secondary | ICD-10-CM | POA: Diagnosis not present

## 2021-04-16 DIAGNOSIS — I10 Essential (primary) hypertension: Secondary | ICD-10-CM | POA: Diagnosis not present

## 2021-05-07 ENCOUNTER — Ambulatory Visit
Admission: RE | Admit: 2021-05-07 | Discharge: 2021-05-07 | Disposition: A | Discharge status: Home / Self Care | Payer: Medicare Other | Source: Ambulatory Visit | Attending: Family Medicine | Admitting: Family Medicine

## 2021-05-07 ENCOUNTER — Other Ambulatory Visit: Payer: Self-pay

## 2021-05-07 ENCOUNTER — Other Ambulatory Visit: Payer: Self-pay | Admitting: Family Medicine

## 2021-05-07 DIAGNOSIS — E782 Mixed hyperlipidemia: Secondary | ICD-10-CM | POA: Diagnosis not present

## 2021-05-07 DIAGNOSIS — I10 Essential (primary) hypertension: Secondary | ICD-10-CM | POA: Diagnosis not present

## 2021-05-07 DIAGNOSIS — E538 Deficiency of other specified B group vitamins: Secondary | ICD-10-CM | POA: Diagnosis not present

## 2021-05-07 DIAGNOSIS — I2510 Atherosclerotic heart disease of native coronary artery without angina pectoris: Secondary | ICD-10-CM | POA: Diagnosis not present

## 2021-05-07 DIAGNOSIS — R059 Cough, unspecified: Secondary | ICD-10-CM

## 2021-05-07 DIAGNOSIS — E1169 Type 2 diabetes mellitus with other specified complication: Secondary | ICD-10-CM | POA: Diagnosis not present

## 2021-05-21 ENCOUNTER — Telehealth: Payer: Self-pay

## 2021-05-21 ENCOUNTER — Ambulatory Visit: Payer: Medicare Other

## 2021-05-21 ENCOUNTER — Other Ambulatory Visit: Payer: Self-pay

## 2021-05-21 DIAGNOSIS — E1140 Type 2 diabetes mellitus with diabetic neuropathy, unspecified: Secondary | ICD-10-CM

## 2021-05-21 NOTE — Progress Notes (Signed)
SITUATION ?Reason for Consult: Follow-up with diabetic shoes ?Patient / Caregiver Report: Shoes are splitting along the seam line at the toe break ? ?OBJECTIVE DATA ?History / Diagnosis:  ?  ICD-10-CM   ?1. Controlled type 2 diabetes with neuropathy ([facility])  E11.40   ?  ? ? ?Change in Pathology: None ? ?ACTIONS PERFORMED ?Patient's equipment was checked for structural stability and fit. Shoes have a manufacturing defect and need to be returned. Replacements are to be ordered and a return authorization generated. Patient did not bring the box back but has it at home and will return to drop off the shoes with the box for return shipping. Ordered replacement V950M 10W. All questions answered and concerns addressed. ? ?PLAN ?Patient to be contacted when replacement shoes ready. Plan of care discussed with and agreed upon by patient / caregiver. ? ?

## 2021-05-21 NOTE — Telephone Encounter (Signed)
Generated return authorization for shoes. [employer] requests we write GIVE CREDIT PER GINA on RA slip #[id number], Case number: [id number]. ? ?Ordered replacement shoes [id number] 10W ?

## 2021-05-26 ENCOUNTER — Other Ambulatory Visit: Payer: Self-pay

## 2021-05-26 ENCOUNTER — Ambulatory Visit: Payer: Medicare Other

## 2021-05-26 DIAGNOSIS — M2141 Flat foot [pes planus] (acquired), right foot: Secondary | ICD-10-CM

## 2021-05-26 DIAGNOSIS — E1140 Type 2 diabetes mellitus with diabetic neuropathy, unspecified: Secondary | ICD-10-CM

## 2021-05-26 DIAGNOSIS — L84 Corns and callosities: Secondary | ICD-10-CM

## 2021-05-26 NOTE — Progress Notes (Signed)
SITUATION ?Reason for Consult: Follow-up with replacement shoes ?Patient / Caregiver Report: Patient is satisfied ? ?OBJECTIVE DATA ?History / Diagnosis:  ?  ICD-10-CM   ?1. Controlled type 2 diabetes with neuropathy (HCC)  E11.40   ?  ?2. Callus of foot  L84   ?  ?3. Pes planus of both feet  M21.41   ? M21.42   ?  ? ? ?Change in Pathology: None ? ?ACTIONS PERFORMED ?Patient's equipment was checked for structural stability and fit. Provided replacement shoes for the ones that split at the seams. Patient is satisfied. Device(s) intact and fit is excellent. All questions answered and concerns addressed. ? ?PLAN ?Follow-up as needed (PRN). Plan of care discussed with and agreed upon by patient / caregiver. ? ?

## 2021-08-13 DIAGNOSIS — J019 Acute sinusitis, unspecified: Secondary | ICD-10-CM | POA: Diagnosis not present

## 2021-08-13 DIAGNOSIS — I10 Essential (primary) hypertension: Secondary | ICD-10-CM | POA: Diagnosis not present

## 2021-08-13 DIAGNOSIS — E782 Mixed hyperlipidemia: Secondary | ICD-10-CM | POA: Diagnosis not present

## 2021-08-13 DIAGNOSIS — J302 Other seasonal allergic rhinitis: Secondary | ICD-10-CM | POA: Diagnosis not present

## 2021-08-13 DIAGNOSIS — L989 Disorder of the skin and subcutaneous tissue, unspecified: Secondary | ICD-10-CM | POA: Diagnosis not present

## 2021-08-13 DIAGNOSIS — D649 Anemia, unspecified: Secondary | ICD-10-CM | POA: Diagnosis not present

## 2021-08-13 DIAGNOSIS — E1169 Type 2 diabetes mellitus with other specified complication: Secondary | ICD-10-CM | POA: Diagnosis not present

## 2021-08-21 ENCOUNTER — Encounter (HOSPITAL_BASED_OUTPATIENT_CLINIC_OR_DEPARTMENT_OTHER): Payer: Self-pay | Admitting: Obstetrics and Gynecology

## 2021-08-21 ENCOUNTER — Other Ambulatory Visit: Payer: Self-pay

## 2021-08-21 ENCOUNTER — Emergency Department (HOSPITAL_BASED_OUTPATIENT_CLINIC_OR_DEPARTMENT_OTHER): Payer: Medicare Other | Admitting: Radiology

## 2021-08-21 ENCOUNTER — Emergency Department (HOSPITAL_BASED_OUTPATIENT_CLINIC_OR_DEPARTMENT_OTHER)
Admission: EM | Admit: 2021-08-21 | Discharge: 2021-08-21 | Disposition: A | Discharge status: Home / Self Care | Payer: Medicare Other | Attending: Emergency Medicine | Admitting: Emergency Medicine

## 2021-08-21 DIAGNOSIS — Z7982 Long term (current) use of aspirin: Secondary | ICD-10-CM | POA: Diagnosis not present

## 2021-08-21 DIAGNOSIS — R011 Cardiac murmur, unspecified: Secondary | ICD-10-CM | POA: Insufficient documentation

## 2021-08-21 DIAGNOSIS — R079 Chest pain, unspecified: Secondary | ICD-10-CM

## 2021-08-21 DIAGNOSIS — Z951 Presence of aortocoronary bypass graft: Secondary | ICD-10-CM | POA: Insufficient documentation

## 2021-08-21 DIAGNOSIS — R0602 Shortness of breath: Secondary | ICD-10-CM | POA: Diagnosis not present

## 2021-08-21 DIAGNOSIS — E860 Dehydration: Secondary | ICD-10-CM | POA: Diagnosis not present

## 2021-08-21 DIAGNOSIS — R42 Dizziness and giddiness: Secondary | ICD-10-CM | POA: Diagnosis not present

## 2021-08-21 DIAGNOSIS — R072 Precordial pain: Secondary | ICD-10-CM | POA: Diagnosis not present

## 2021-08-21 DIAGNOSIS — Z79899 Other long term (current) drug therapy: Secondary | ICD-10-CM | POA: Diagnosis not present

## 2021-08-21 DIAGNOSIS — I10 Essential (primary) hypertension: Secondary | ICD-10-CM | POA: Insufficient documentation

## 2021-08-21 DIAGNOSIS — I2510 Atherosclerotic heart disease of native coronary artery without angina pectoris: Secondary | ICD-10-CM | POA: Insufficient documentation

## 2021-08-21 DIAGNOSIS — R0789 Other chest pain: Secondary | ICD-10-CM | POA: Diagnosis not present

## 2021-08-21 LAB — URINALYSIS, ROUTINE W REFLEX MICROSCOPIC
Bilirubin Urine: NEGATIVE
Glucose, UA: >1000 mg/dL — AB
Hgb urine dipstick: NEGATIVE
Ketones, ur: NEGATIVE mg/dL
Leukocytes,Ua: NEGATIVE
Nitrite: NEGATIVE
Specific Gravity, Urine: 1.028 (ref 1.005–1.030)
pH: 5 (ref 5.0–8.0)

## 2021-08-21 LAB — CBC
HCT: 39.2 % (ref 39.0–52.0)
Hemoglobin: 13.1 g/dL (ref 13.0–17.0)
MCH: 28.9 pg (ref 26.0–34.0)
MCHC: 33.4 g/dL (ref 30.0–36.0)
MCV: 86.5 fL (ref 80.0–100.0)
Platelets: 361 10*3/uL (ref 150–400)
RBC: 4.53 MIL/uL (ref 4.22–5.81)
RDW: 12.6 % (ref 11.5–15.5)
WBC: 13.9 10*3/uL — ABNORMAL HIGH (ref 4.0–10.5)
nRBC: 0 % (ref 0.0–0.2)

## 2021-08-21 LAB — BASIC METABOLIC PANEL
Anion gap: 14 (ref 5–15)
BUN: 35 mg/dL — ABNORMAL HIGH (ref 8–23)
CO2: 25 mmol/L (ref 22–32)
Calcium: 10.6 mg/dL — ABNORMAL HIGH (ref 8.9–10.3)
Chloride: 102 mmol/L (ref 98–111)
Creatinine, Ser: 1.7 mg/dL — ABNORMAL HIGH (ref 0.61–1.24)
GFR, Estimated: 42 mL/min — ABNORMAL LOW (ref >60)
Glucose, Bld: 110 mg/dL — ABNORMAL HIGH (ref 70–99)
Potassium: 4 mmol/L (ref 3.5–5.1)
Sodium: 141 mmol/L (ref 135–145)

## 2021-08-21 LAB — BRAIN NATRIURETIC PEPTIDE: B Natriuretic Peptide: 25.6 pg/mL (ref 0.0–100.0)

## 2021-08-21 LAB — TROPONIN I (HIGH SENSITIVITY)
Troponin I (High Sensitivity): 5 ng/L (ref <18)
Troponin I (High Sensitivity): 4 ng/L (ref <18)

## 2021-08-21 MED ORDER — SODIUM CHLORIDE 0.9 % IV BOLUS
1000.0000 mL | Freq: Once | INTRAVENOUS | Status: AC
  Administered 2021-08-21: 1000 mL via INTRAVENOUS

## 2021-08-21 NOTE — ED Provider Notes (Signed)
[facility] EMERGENCY DEPT Provider Note   CSN: [id number] Arrival date & time: 08/21/21  1156     History {Add pertinent medical, surgical, social history, OB history to HPI:1} Chief Complaint  Patient presents with   Chest Pain    MONTRELL CESSNA is a 76 y.o. male.  He has a history of coronary disease, CABG.  He is here with complaint of some sharp central chest pain that occurred twice today lasted about 10 minutes each time.  Associated with some shortness of breath.  He said he has been dizzy for the last week or so and was diagnosed with a sinus infection and put on antibiotics and antihistamines.  He says this chest pain is different than when he had his cardiac event in the past which was more pressure.  He said this pain was severe in nature.  None currently.  The history is provided by the patient.  Chest Pain Pain location:  Substernal area Pain quality: sharp and stabbing   Pain radiates to:  Does not radiate Pain severity:  Severe Onset quality:  Sudden Duration:  10 minutes Timing:  Intermittent Progression:  Resolved Chronicity:  New Context: at rest   Relieved by:  None tried Worsened by:  Nothing Ineffective treatments:  None tried Associated symptoms: dizziness and shortness of breath   Associated symptoms: no abdominal pain, no cough, no diaphoresis, no fever, no nausea and no vomiting   Risk factors: coronary artery disease, high cholesterol, hypertension and male sex   Risk factors: no smoking       Home Medications Prior to Admission medications   Medication Sig Start Date End Date Taking? Authorizing Provider  amLODipine (NORVASC) 10 MG tablet Take 10 mg by mouth daily.    [provider]  aspirin 81 MG EC tablet Take 81 mg by mouth daily. Swallow whole.    [provider]  atorvastatin (LIPITOR) 40 MG tablet Take 40 mg by mouth daily.    [provider]  chlorpheniramine-HYDROcodone (TUSSIONEX) 10-8 MG/5ML  SUER Take 5 mLs by mouth every 12 (twelve) hours as needed for cough. 11/13/19   Caren Griffins, MD  dexamethasone (DECADRON) 6 MG tablet Take 1 tablet (6 mg total) by mouth daily. 11/13/19   Caren Griffins, MD  glipiZIDE (GLUCOTROL XL) 10 MG 24 hr tablet Take 10 mg by mouth daily with breakfast.    [provider]  guaiFENesin-dextromethorphan (ROBITUSSIN DM) 100-10 MG/5ML syrup Take 10 mLs by mouth every 4 (four) hours as needed for cough. 11/13/19   Caren Griffins, MD  hydrochlorothiazide (HYDRODIURIL) 25 MG tablet Take 1 tablet by mouth every morning. 08/05/20   [provider]  losartan (COZAAR) 100 MG tablet Take 100 mg by mouth daily.    [provider]  metFORMIN (GLUCOPHAGE) 1000 MG tablet Take 1,000 mg by mouth 2 (two) times daily with a meal.    [provider]  methocarbamol (ROBAXIN) 750 MG tablet Take 750 mg by mouth as needed for muscle spasms.    [provider]  metoprolol tartrate (LOPRESSOR) 25 MG tablet Take 25 mg by mouth 2 (two) times daily.    [provider]  nitroGLYCERIN (NITROSTAT) 0.4 MG SL tablet PLACE 1 TABLET UNDER THE TONGUE EVERY 5 MINUTES AS NEEDED FOR CHEST PAIN. 04/04/20   Minus Breeding, MD  ROCKLATAN 0.02-0.005 % SOLN Place 1 drop into both eyes daily.  10/02/19   [provider]  timolol (TIMOPTIC) 0.5 % ophthalmic  solution 1 drop every morning. 08/19/20   [provider]      Allergies    Patient has no known allergies.    Review of Systems   Review of Systems  Constitutional:  Negative for diaphoresis and fever.  HENT:  Negative for sore throat.   Eyes:  Negative for visual disturbance.  Respiratory:  Positive for shortness of breath. Negative for cough.   Cardiovascular:  Positive for chest pain.  Gastrointestinal:  Negative for abdominal pain, nausea and vomiting.  Genitourinary:  Negative for dysuria.  Musculoskeletal:  Negative for neck pain.  Skin:  Negative for rash.   Neurological:  Positive for dizziness.   Physical Exam Updated Vital Signs BP 119/76   Pulse 83   Temp 97.9 F (36.6 C)   Resp (!) 21   Ht '5\' 10"'$  (1.778 m)   Wt 89 kg   SpO2 97%   BMI 28.15 kg/m  Physical Exam Vitals and nursing note reviewed.  Constitutional:      General: He is not in acute distress.    Appearance: He is well-developed.  HENT:     Head: Normocephalic and atraumatic.  Eyes:     Conjunctiva/sclera: Conjunctivae normal.  Cardiovascular:     Rate and Rhythm: Normal rate and regular rhythm.     Heart sounds: Murmur heard.  Systolic murmur is present with a grade of 2/6.  Pulmonary:     Effort: Pulmonary effort is normal. No respiratory distress.     Breath sounds: Normal breath sounds.  Abdominal:     Palpations: Abdomen is soft.     Tenderness: There is no abdominal tenderness.  Musculoskeletal:        General: No swelling.     Cervical back: Neck supple.  Skin:    General: Skin is warm and dry.     Capillary Refill: Capillary refill takes less than 2 seconds.  Neurological:     Mental Status: He is alert.  Psychiatric:        Mood and Affect: Mood normal.    ED Results / Procedures / Treatments   Labs (all labs ordered are listed, but only abnormal results are displayed) Labs Reviewed  BASIC METABOLIC PANEL - Abnormal; Notable for the following components:      Result Value   Glucose, Bld 110 (*)    BUN 35 (*)    Creatinine, Ser 1.70 (*)    Calcium 10.6 (*)    GFR, Estimated 42 (*)    All other components within normal limits  CBC - Abnormal; Notable for the following components:   WBC 13.9 (*)    All other components within normal limits  URINALYSIS, ROUTINE W REFLEX MICROSCOPIC - Abnormal; Notable for the following components:   Glucose, UA >1,000 (*)    Protein, ur TRACE (*)    All other components within normal limits  BRAIN NATRIURETIC PEPTIDE  TROPONIN I (HIGH SENSITIVITY)  TROPONIN I (HIGH SENSITIVITY)    EKG EKG  Interpretation  Date/Time:  Friday August 21 2021 12:03:28 EDT Ventricular Rate:  81 PR Interval:  180 QRS Duration: 78 QT Interval:  368 QTC Calculation: 427 R Axis:   113 Text Interpretation: Normal sinus rhythm Right axis deviation slight ST elevation inferior Confirmed by Aletta Edouard [phone]) on 08/21/2021 3:21:17 PM  Radiology DG Chest 2 View  Result Date: 08/21/2021 CLINICAL DATA:  Chest pain EXAM: CHEST - 2 VIEW COMPARISON:  Previous studies including the examination of 05/07/2021 FINDINGS: Cardiac size  is within normal limits. There is previous coronary artery bypass surgery. Lung fields are clear of any infiltrates or pulmonary edema. There is no pleural effusion or pneumothorax. No significant interval changes are noted in the lung fields. IMPRESSION: No active cardiopulmonary disease. Electronically Signed   By: Elmer Picker M.D.   On: 08/21/2021 12:29    Procedures Procedures  {Document cardiac monitor, telemetry assessment procedure when appropriate:1}  Medications Ordered in ED Medications  sodium chloride 0.9 % bolus 1,000 mL (1,000 mLs Intravenous New Bag/Given 08/21/21 1501)    ED Course/ Medical Decision Making/ A&P                           Medical Decision Making Amount and/or Complexity of Data Reviewed Labs: ordered. Radiology: ordered.   This patient complains of ***; this involves an extensive number of treatment Options and is a complaint that carries with it a high risk of complications and morbidity. The differential includes ***  I ordered, reviewed and interpreted labs, which included *** I ordered medication *** and reviewed PMP when indicated. I ordered imaging studies which included *** and I independently    visualized and interpreted imaging which showed *** Additional history obtained from *** Previous records obtained and reviewed *** I consulted *** and discussed lab and imaging findings and discussed disposition.  Cardiac  monitoring reviewed, *** Social determinants considered, *** Critical Interventions: ***  After the interventions stated above, I reevaluated the patient and found *** Admission and further testing considered, ***   {Document critical care time when appropriate:1} {Document review of labs and clinical decision tools ie heart score, Chads2Vasc2 etc:1}  {Document your independent review of radiology images, and any outside records:1} {Document your discussion with family members, caretakers, and with consultants:1} {Document social determinants of health affecting pt's care:1} {Document your decision making why or why not admission, treatments were needed:1} Final Clinical Impression(s) / ED Diagnoses Final diagnoses:  None    Rx / DC Orders ED Discharge Orders     None

## 2021-08-21 NOTE — ED Triage Notes (Signed)
Patient presents to the ER for chest pain for the last hour. Patient reports he has a hx of triple bypass. Patient denies nausea  or emesis but reports shortness of breath.

## 2021-08-21 NOTE — Discharge Instructions (Signed)
You were seen in the emergency department for evaluation of sharp chest pain after working in the yard today.  Your lab work did not show any evidence of heart attack.  You did look dehydrated and received some IV fluids.  Please continue your regular medications and drink plenty of fluids.  Contact your cardiologist for close follow-up.  Return to the emergency department if any worsening or concerning symptoms.

## 2021-08-21 NOTE — ED Notes (Signed)
Discharge instructions and follow up care with cardiology reviewed and explained. Pt verbalized understanding. Pt caox4 and ambulatory denying pain on departure.

## 2021-09-10 ENCOUNTER — Other Ambulatory Visit: Payer: Self-pay | Admitting: Gastroenterology

## 2021-09-10 ENCOUNTER — Telehealth: Payer: Self-pay

## 2021-09-10 DIAGNOSIS — D509 Iron deficiency anemia, unspecified: Secondary | ICD-10-CM | POA: Diagnosis not present

## 2021-09-10 DIAGNOSIS — R634 Abnormal weight loss: Secondary | ICD-10-CM | POA: Diagnosis not present

## 2021-09-10 NOTE — Telephone Encounter (Signed)
   Name: Mathew Clements  DOB: 08-03-1945  MRN: [id number]  Primary Cardiologist: Minus Breeding, MD  Chart reviewed as part of pre-operative protocol coverage. Because of Mathew Clements's past medical history and time since last visit, he will require a follow-up in-office visit in order to better assess preoperative cardiovascular risk.  Pre-op covering staff: - Please schedule appointment and call patient to inform them. If patient already had an upcoming appointment within acceptable timeframe, please add "pre-op clearance" to the appointment notes so provider is aware. - Please contact requesting surgeon's office via preferred method (i.e, phone, fax) to inform them of need for appointment prior to surgery.   Tami Lin Herlinda Heady, PA  09/10/2021, 5:05 PM

## 2021-09-10 NOTE — Telephone Encounter (Signed)
   Pre-operative Risk Assessment    Patient Name: Mathew Clements  DOB: 09/16/1945 MRN: [id number]      Request for Surgical Clearance    Procedure:   Endoscopy  Date of Surgery:  Clearance 09/23/21                                 Surgeon:  Dr. Clarene Essex Surgeon's Group or Practice Name:  [facility] Phone number:  [phone] Fax number:  [phone]   Type of Clearance Requested:   - Medical    Type of Anesthesia:   Propofol   Additional requests/questions:  Please advise surgeon/provider what medications should be held.  Signed, Elsie Lincoln Refugio Vandevoorde   09/10/2021, 4:02 PM

## 2021-09-15 ENCOUNTER — Ambulatory Visit (INDEPENDENT_AMBULATORY_CARE_PROVIDER_SITE_OTHER): Payer: Medicare Other | Admitting: Cardiology

## 2021-09-15 ENCOUNTER — Encounter: Payer: Self-pay | Admitting: Cardiology

## 2021-09-15 VITALS — BP 116/70 | HR 78 | Ht 70.0 in | Wt 198.1 lb

## 2021-09-15 DIAGNOSIS — E785 Hyperlipidemia, unspecified: Secondary | ICD-10-CM | POA: Diagnosis not present

## 2021-09-15 DIAGNOSIS — E118 Type 2 diabetes mellitus with unspecified complications: Secondary | ICD-10-CM

## 2021-09-15 DIAGNOSIS — I2510 Atherosclerotic heart disease of native coronary artery without angina pectoris: Secondary | ICD-10-CM | POA: Diagnosis not present

## 2021-09-15 DIAGNOSIS — I10 Essential (primary) hypertension: Secondary | ICD-10-CM

## 2021-09-16 NOTE — Telephone Encounter (Signed)
Will forward to pre op pool for review if the pt has been cleared.

## 2021-09-16 NOTE — Telephone Encounter (Signed)
  [facility] calling back to f/u clearance. Pt was seen yesterday.

## 2021-09-16 NOTE — Telephone Encounter (Signed)
Pt was seen yesterday by Dr. Percival Spanish for clearance.  Per Dr. Rosezella Florida note, pt had EKG changes and atypical chest pain. She is pending echocardiogram.   I will defer decisions related to clearance to Dr. Percival Spanish. He is not cleared for procedure at this time.

## 2021-09-17 DIAGNOSIS — D72829 Elevated white blood cell count, unspecified: Secondary | ICD-10-CM | POA: Diagnosis not present

## 2021-09-28 DIAGNOSIS — E782 Mixed hyperlipidemia: Secondary | ICD-10-CM | POA: Diagnosis not present

## 2021-09-28 DIAGNOSIS — I2510 Atherosclerotic heart disease of native coronary artery without angina pectoris: Secondary | ICD-10-CM | POA: Diagnosis not present

## 2021-09-28 DIAGNOSIS — E1169 Type 2 diabetes mellitus with other specified complication: Secondary | ICD-10-CM | POA: Diagnosis not present

## 2021-09-28 DIAGNOSIS — I10 Essential (primary) hypertension: Secondary | ICD-10-CM | POA: Diagnosis not present

## 2021-09-29 ENCOUNTER — Ambulatory Visit (HOSPITAL_COMMUNITY): Payer: Medicare Other | Attending: Cardiovascular Disease

## 2021-09-29 DIAGNOSIS — I2510 Atherosclerotic heart disease of native coronary artery without angina pectoris: Secondary | ICD-10-CM | POA: Diagnosis not present

## 2021-09-29 MED ORDER — PERFLUTREN LIPID MICROSPHERE
1.0000 mL | INTRAVENOUS | Status: AC | PRN
  Administered 2021-09-29: 1 mL via INTRAVENOUS

## 2021-09-30 LAB — ECHOCARDIOGRAM COMPLETE
AR max vel: 2.38 cm2
AV Area VTI: 2.17 cm2
AV Area mean vel: 2.26 cm2
AV Mean grad: 14 mmHg
AV Peak grad: 24 mmHg
Ao pk vel: 2.45 m/s
Area-P 1/2: 3.27 cm2
S' Lateral: 2.7 cm

## 2021-10-01 ENCOUNTER — Telehealth: Payer: Self-pay | Admitting: *Deleted

## 2021-10-01 DIAGNOSIS — I2510 Atherosclerotic heart disease of native coronary artery without angina pectoris: Secondary | ICD-10-CM

## 2021-10-01 NOTE — Telephone Encounter (Signed)
-----   Message from Minus Breeding, MD sent at 09/30/2021  9:13 AM EDT ----- Echo looks okay and I would like to screen him with a Lexiscan Myoview.  Call Mr. Lysaght with the results and send results to Caren Macadam, MD

## 2021-10-05 ENCOUNTER — Telehealth (HOSPITAL_COMMUNITY): Payer: Self-pay

## 2021-10-05 NOTE — Telephone Encounter (Signed)
Spoke with the patient, detailed instructions given. He stated that he would be here for his test. Asked to call back with any questions. S.Kashaun Bebo EMTP 

## 2021-10-06 ENCOUNTER — Ambulatory Visit (HOSPITAL_COMMUNITY): Payer: Medicare Other | Attending: Cardiology

## 2021-10-06 DIAGNOSIS — I2510 Atherosclerotic heart disease of native coronary artery without angina pectoris: Secondary | ICD-10-CM | POA: Insufficient documentation

## 2021-10-06 LAB — MYOCARDIAL PERFUSION IMAGING
LV dias vol: 82 mL (ref 62–150)
LV sys vol: 35 mL
Nuc Stress EF: 58 %
Peak HR: 83 {beats}/min
Rest HR: 64 {beats}/min
Rest Nuclear Isotope Dose: 9.8 mCi
SDS: 0
SRS: 0
SSS: 0
ST Depression (mm): 0 mm
Stress Nuclear Isotope Dose: 31.7 mCi
TID: 1.02

## 2021-10-06 MED ORDER — REGADENOSON 0.4 MG/5ML IV SOLN
0.4000 mg | Freq: Once | INTRAVENOUS | Status: AC
  Administered 2021-10-06: 0.4 mg via INTRAVENOUS

## 2021-10-06 MED ORDER — TECHNETIUM TC 99M TETROFOSMIN IV KIT
31.7000 | PACK | Freq: Once | INTRAVENOUS | Status: AC | PRN
  Administered 2021-10-06: 31.7 via INTRAVENOUS

## 2021-10-06 MED ORDER — TECHNETIUM TC 99M TETROFOSMIN IV KIT
9.8000 | PACK | Freq: Once | INTRAVENOUS | Status: AC | PRN
  Administered 2021-10-06: 9.8 via INTRAVENOUS

## 2021-10-09 ENCOUNTER — Ambulatory Visit
Admission: RE | Admit: 2021-10-09 | Discharge: 2021-10-09 | Disposition: A | Discharge status: Home / Self Care | Payer: Medicare Other | Source: Ambulatory Visit | Attending: Gastroenterology | Admitting: Gastroenterology

## 2021-10-09 DIAGNOSIS — R634 Abnormal weight loss: Secondary | ICD-10-CM | POA: Diagnosis not present

## 2021-10-09 DIAGNOSIS — N200 Calculus of kidney: Secondary | ICD-10-CM | POA: Diagnosis not present

## 2021-10-09 DIAGNOSIS — K8020 Calculus of gallbladder without cholecystitis without obstruction: Secondary | ICD-10-CM | POA: Diagnosis not present

## 2021-10-09 DIAGNOSIS — Q632 Ectopic kidney: Secondary | ICD-10-CM | POA: Diagnosis not present

## 2021-10-09 MED ORDER — IOPAMIDOL (ISOVUE-300) INJECTION 61%
80.0000 mL | Freq: Once | INTRAVENOUS | Status: AC | PRN
  Administered 2021-10-09: 80 mL via INTRAVENOUS

## 2021-11-12 DIAGNOSIS — I10 Essential (primary) hypertension: Secondary | ICD-10-CM | POA: Diagnosis not present

## 2021-11-12 DIAGNOSIS — N481 Balanitis: Secondary | ICD-10-CM | POA: Diagnosis not present

## 2021-11-12 DIAGNOSIS — E1169 Type 2 diabetes mellitus with other specified complication: Secondary | ICD-10-CM | POA: Diagnosis not present

## 2021-11-12 DIAGNOSIS — E782 Mixed hyperlipidemia: Secondary | ICD-10-CM | POA: Diagnosis not present

## 2021-11-12 DIAGNOSIS — Z23 Encounter for immunization: Secondary | ICD-10-CM | POA: Diagnosis not present

## 2021-11-12 DIAGNOSIS — Z0000 Encounter for general adult medical examination without abnormal findings: Secondary | ICD-10-CM | POA: Diagnosis not present

## 2021-11-12 DIAGNOSIS — D649 Anemia, unspecified: Secondary | ICD-10-CM | POA: Diagnosis not present

## 2021-11-12 DIAGNOSIS — I2510 Atherosclerotic heart disease of native coronary artery without angina pectoris: Secondary | ICD-10-CM | POA: Diagnosis not present

## 2021-11-16 DIAGNOSIS — N481 Balanitis: Secondary | ICD-10-CM | POA: Diagnosis not present

## 2021-11-16 DIAGNOSIS — N471 Phimosis: Secondary | ICD-10-CM | POA: Diagnosis not present

## 2021-11-19 DIAGNOSIS — D485 Neoplasm of uncertain behavior of skin: Secondary | ICD-10-CM | POA: Diagnosis not present

## 2021-11-19 DIAGNOSIS — C4441 Basal cell carcinoma of skin of scalp and neck: Secondary | ICD-10-CM | POA: Diagnosis not present

## 2021-11-19 DIAGNOSIS — C44229 Squamous cell carcinoma of skin of left ear and external auricular canal: Secondary | ICD-10-CM | POA: Diagnosis not present

## 2021-11-19 DIAGNOSIS — D1801 Hemangioma of skin and subcutaneous tissue: Secondary | ICD-10-CM | POA: Diagnosis not present

## 2021-11-19 DIAGNOSIS — L814 Other melanin hyperpigmentation: Secondary | ICD-10-CM | POA: Diagnosis not present

## 2021-11-19 DIAGNOSIS — L821 Other seborrheic keratosis: Secondary | ICD-10-CM | POA: Diagnosis not present

## 2021-11-19 DIAGNOSIS — C44329 Squamous cell carcinoma of skin of other parts of face: Secondary | ICD-10-CM | POA: Diagnosis not present

## 2021-11-26 DIAGNOSIS — K449 Diaphragmatic hernia without obstruction or gangrene: Secondary | ICD-10-CM | POA: Diagnosis not present

## 2021-11-26 DIAGNOSIS — D509 Iron deficiency anemia, unspecified: Secondary | ICD-10-CM | POA: Diagnosis not present

## 2021-11-26 DIAGNOSIS — K5710 Diverticulosis of small intestine without perforation or abscess without bleeding: Secondary | ICD-10-CM | POA: Diagnosis not present

## 2021-12-03 DIAGNOSIS — E119 Type 2 diabetes mellitus without complications: Secondary | ICD-10-CM | POA: Diagnosis not present

## 2021-12-03 DIAGNOSIS — H40053 Ocular hypertension, bilateral: Secondary | ICD-10-CM | POA: Diagnosis not present

## 2021-12-03 DIAGNOSIS — H2513 Age-related nuclear cataract, bilateral: Secondary | ICD-10-CM | POA: Diagnosis not present

## 2021-12-03 DIAGNOSIS — H40023 Open angle with borderline findings, high risk, bilateral: Secondary | ICD-10-CM | POA: Diagnosis not present

## 2021-12-08 DIAGNOSIS — C4442 Squamous cell carcinoma of skin of scalp and neck: Secondary | ICD-10-CM | POA: Diagnosis not present

## 2021-12-10 DIAGNOSIS — Z23 Encounter for immunization: Secondary | ICD-10-CM | POA: Diagnosis not present

## 2021-12-10 DIAGNOSIS — I10 Essential (primary) hypertension: Secondary | ICD-10-CM | POA: Diagnosis not present

## 2021-12-22 DIAGNOSIS — Z48817 Encounter for surgical aftercare following surgery on the skin and subcutaneous tissue: Secondary | ICD-10-CM | POA: Diagnosis not present

## 2021-12-22 DIAGNOSIS — S0100XA Unspecified open wound of scalp, initial encounter: Secondary | ICD-10-CM | POA: Diagnosis not present

## 2021-12-22 DIAGNOSIS — Z4801 Encounter for change or removal of surgical wound dressing: Secondary | ICD-10-CM | POA: Diagnosis not present

## 2021-12-29 DIAGNOSIS — C44229 Squamous cell carcinoma of skin of left ear and external auricular canal: Secondary | ICD-10-CM | POA: Diagnosis not present

## 2021-12-29 DIAGNOSIS — L578 Other skin changes due to chronic exposure to nonionizing radiation: Secondary | ICD-10-CM | POA: Diagnosis not present

## 2021-12-29 DIAGNOSIS — L570 Actinic keratosis: Secondary | ICD-10-CM | POA: Diagnosis not present

## 2022-01-12 DIAGNOSIS — D485 Neoplasm of uncertain behavior of skin: Secondary | ICD-10-CM | POA: Diagnosis not present

## 2022-01-12 DIAGNOSIS — C44329 Squamous cell carcinoma of skin of other parts of face: Secondary | ICD-10-CM | POA: Diagnosis not present

## 2022-02-09 DIAGNOSIS — E1169 Type 2 diabetes mellitus with other specified complication: Secondary | ICD-10-CM | POA: Diagnosis not present

## 2022-02-09 DIAGNOSIS — I10 Essential (primary) hypertension: Secondary | ICD-10-CM | POA: Diagnosis not present

## 2022-02-09 DIAGNOSIS — E782 Mixed hyperlipidemia: Secondary | ICD-10-CM | POA: Diagnosis not present

## 2022-02-09 DIAGNOSIS — I2510 Atherosclerotic heart disease of native coronary artery without angina pectoris: Secondary | ICD-10-CM | POA: Diagnosis not present

## 2022-02-18 DIAGNOSIS — L570 Actinic keratosis: Secondary | ICD-10-CM | POA: Diagnosis not present

## 2022-02-18 DIAGNOSIS — L578 Other skin changes due to chronic exposure to nonionizing radiation: Secondary | ICD-10-CM | POA: Diagnosis not present

## 2022-02-18 DIAGNOSIS — L905 Scar conditions and fibrosis of skin: Secondary | ICD-10-CM | POA: Diagnosis not present

## 2022-02-23 DIAGNOSIS — L814 Other melanin hyperpigmentation: Secondary | ICD-10-CM | POA: Diagnosis not present

## 2022-02-23 DIAGNOSIS — D1801 Hemangioma of skin and subcutaneous tissue: Secondary | ICD-10-CM | POA: Diagnosis not present

## 2022-02-23 DIAGNOSIS — C44529 Squamous cell carcinoma of skin of other part of trunk: Secondary | ICD-10-CM | POA: Diagnosis not present

## 2022-02-23 DIAGNOSIS — L821 Other seborrheic keratosis: Secondary | ICD-10-CM | POA: Diagnosis not present

## 2022-02-23 DIAGNOSIS — D485 Neoplasm of uncertain behavior of skin: Secondary | ICD-10-CM | POA: Diagnosis not present

## 2022-02-23 DIAGNOSIS — C44329 Squamous cell carcinoma of skin of other parts of face: Secondary | ICD-10-CM | POA: Diagnosis not present

## 2022-02-23 DIAGNOSIS — L570 Actinic keratosis: Secondary | ICD-10-CM | POA: Diagnosis not present

## 2022-02-23 DIAGNOSIS — Z08 Encounter for follow-up examination after completed treatment for malignant neoplasm: Secondary | ICD-10-CM | POA: Diagnosis not present

## 2022-02-23 DIAGNOSIS — Z85828 Personal history of other malignant neoplasm of skin: Secondary | ICD-10-CM | POA: Diagnosis not present

## 2022-02-25 DIAGNOSIS — R3915 Urgency of urination: Secondary | ICD-10-CM | POA: Diagnosis not present

## 2022-02-25 DIAGNOSIS — N481 Balanitis: Secondary | ICD-10-CM | POA: Diagnosis not present

## 2022-03-11 ENCOUNTER — Ambulatory Visit: Payer: Medicare Other | Attending: Nurse Practitioner | Admitting: Nurse Practitioner

## 2022-03-11 ENCOUNTER — Encounter: Payer: Self-pay | Admitting: Nurse Practitioner

## 2022-03-11 VITALS — BP 138/70 | HR 71 | Ht 70.0 in | Wt 194.0 lb

## 2022-03-11 DIAGNOSIS — E118 Type 2 diabetes mellitus with unspecified complications: Secondary | ICD-10-CM

## 2022-03-11 DIAGNOSIS — E785 Hyperlipidemia, unspecified: Secondary | ICD-10-CM

## 2022-03-11 DIAGNOSIS — I350 Nonrheumatic aortic (valve) stenosis: Secondary | ICD-10-CM

## 2022-03-11 DIAGNOSIS — I10 Essential (primary) hypertension: Secondary | ICD-10-CM

## 2022-03-11 DIAGNOSIS — I25118 Atherosclerotic heart disease of native coronary artery with other forms of angina pectoris: Secondary | ICD-10-CM | POA: Diagnosis not present

## 2022-03-11 NOTE — Patient Instructions (Signed)
Medication Instructions:  Your physician recommends that you continue on your current medications as directed. Please refer to the Current Medication list given to you today.   *If you need a refill on your cardiac medications before your next appointment, please call your pharmacy*   Lab Work: NONE ordered at this time of appointment   If you have labs (blood work) drawn today and your tests are completely normal, you will receive your results only by: [place] (if you have MyChart) OR A paper copy in the mail If you have any lab test that is abnormal or we need to change your treatment, we will call you to review the results.   Testing/Procedures: NONE ordered at this time of appointment     Follow-Up: At [facility], you and your health needs are our priority.  As part of our continuing mission to provide you with exceptional heart care, we have created designated Provider Care Teams.  These Care Teams include your primary Cardiologist (physician) and Advanced Practice Providers (APPs -  Physician Assistants and Nurse Practitioners) who all work together to provide you with the care you need, when you need it.  We recommend signing up for the patient portal called "MyChart".  Sign up information is provided on this After Visit Summary.  MyChart is used to connect with patients for Virtual Visits (Telemedicine).  Patients are able to view lab/test results, encounter notes, upcoming appointments, etc.  Non-urgent messages can be sent to your provider as well.   To learn more about what you can do with MyChart, go to [email or web address].    Your next appointment:   6 month(s)  The format for your next appointment:   In Person  Provider:   Minus Breeding, MD     Other Instructions   Important Information About Sugar

## 2022-03-11 NOTE — Progress Notes (Signed)
Office Visit    Patient Name: Mathew Clements Date of Encounter: 03/11/2022  Primary Care Provider:  Caren Macadam, MD Primary Cardiologist:  Minus Breeding, MD  Chief Complaint    76 year old male with a history of CAD s/p CABG x 3 in 2012, mild aortic stenosis, hypertension, hyperlipidemia, and type 2 diabetes who presents for follow-up related to CAD.  Past Medical History    Past Medical History:  Diagnosis Date   Anemia    Arthritis    CAD (coronary artery disease)    with an ejection fraction of 65%   Cancer ([place])    skin cancer right ear   Diabetes mellitus    H/O: Bell's palsy    Hemorrhoid    HTN (hypertension)    Hyperlipemia    Kidney stone    Myocardial infarction [facility])    Cath July 2012 LAD 80% stenosis proximal with distal diffuse 80% stenosis, circumflex with obtuse marginal number 2 95% stenosis.   Past Surgical History:  Procedure Laterality Date   BACK SURGERY     CARDIAC CATHETERIZATION  10/15/2010   Dr Percival Spanish   CORONARY ARTERY BYPASS GRAFT  10/20/2010   x 3, Dr Servando Snare.  LIMA to the LAD, SVG to PDA, SVG to first diagonal   TOTAL KNEE ARTHROPLASTY Left 05/09/2012   Procedure: TOTAL KNEE ARTHROPLASTY;  Surgeon: Hessie Dibble, MD;  Location: [facility];  Service: Orthopedics;  Laterality: Left;    Allergies  No Known Allergies  History of Present Illness    76 year old male with the above past medical history including CAD s/p CABG x 3 in 2012, mild aortic stenosis, hypertension, hyperlipidemia, and type 2 diabetes.  He underwent CABG x 3 (LIMA-LAD, SVG-PDA, SVG-D1) in 2012.  He presented to the ED in 08/2021 with chest pain.  Troponin was negative, EKG was without acute changes.  He was last seen in the office on 09/15/2021 noted ongoing intermittent chest discomfort.  EKG demonstrated new anterior Q waves.  Echocardiogram showed EF 60 to 65%, normal LV function, G1 DD, normal RV systolic function, mild aortic stenosis.  Lexiscan Myoview was low  risk.  He presents today for follow-up companied by his wife.  Since his last visit been stable from a cardiac standpoint. He notes stable occasional mild dyspnea with significant exertion, denies chest pain.  He denies palpitations, dizziness, edema, PND, orthopnea, weight gain.  Overall, he reports feeling well.  Home Medications    Current Outpatient Medications  Medication Sig Dispense Refill   amLODipine (NORVASC) 10 MG tablet Take 10 mg by mouth daily.     aspirin 81 MG EC tablet Take 81 mg by mouth daily. Swallow whole.     aspirin EC (BAYER ASPIRIN EC LOW DOSE) 81 MG tablet 81 mg.     atorvastatin (LIPITOR) 40 MG tablet Take 40 mg by mouth daily.     chlorpheniramine-HYDROcodone (TUSSIONEX) 10-8 MG/5ML SUER Take 5 mLs by mouth every 12 (twelve) hours as needed for cough. 140 mL 0   Cyanocobalamin (VITAMIN B12) 1000 MCG TBCR 1,000 mcg.     dexamethasone (DECADRON) 6 MG tablet Take 1 tablet (6 mg total) by mouth daily. 4 tablet 0   empagliflozin (JARDIANCE) 10 MG TABS tablet 10 mg.     glipiZIDE (GLUCOTROL XL) 10 MG 24 hr tablet Take 10 mg by mouth daily with breakfast.     guaiFENesin-dextromethorphan (ROBITUSSIN DM) 100-10 MG/5ML syrup Take 10 mLs by mouth every 4 (four) hours as needed for  cough. 118 mL 0   hydrochlorothiazide (HYDRODIURIL) 25 MG tablet Take 1 tablet by mouth every morning.     loratadine (CLARITIN) 10 MG tablet Take 10 mg by mouth daily.     losartan (COZAAR) 100 MG tablet Take 100 mg by mouth daily.     metFORMIN (GLUCOPHAGE) 1000 MG tablet Take 1,000 mg by mouth 2 (two) times daily with a meal.     methocarbamol (ROBAXIN) 750 MG tablet Take 750 mg by mouth as needed for muscle spasms.     metoprolol tartrate (LOPRESSOR) 25 MG tablet Take 25 mg by mouth 2 (two) times daily.     nitroGLYCERIN (NITROSTAT) 0.4 MG SL tablet PLACE 1 TABLET UNDER THE TONGUE EVERY 5 MINUTES AS NEEDED FOR CHEST PAIN. 30 tablet 2   ROCKLATAN 0.02-0.005 % SOLN Place 1 drop into both eyes  daily.     sertraline (ZOLOFT) 25 MG tablet Take 25 mg by mouth daily.     timolol (TIMOPTIC) 0.5 % ophthalmic solution 1 drop every morning.     No current facility-administered medications for this visit.     Review of Systems    He denies chest pain, palpitations, dyspnea, pnd, orthopnea, n, v, dizziness, syncope, edema, weight gain, or early satiety. All other systems reviewed and are otherwise negative except as noted above.   Physical Exam    VS:  BP 138/70   Pulse 71   Ht '5\' 10"'$  (1.778 m)   Wt 194 lb (88 kg)   SpO2 97%   BMI 27.84 kg/m   GEN: Well nourished, well developed, in no acute distress. HEENT: normal. Neck: Supple, no JVD, carotid bruits, or masses. Cardiac: RRR, no murmurs, rubs, or gallops. No clubbing, cyanosis, edema.  Radials/DP/PT 2+ and equal bilaterally.  Respiratory:  Respirations regular and unlabored, clear to auscultation bilaterally. GI: Soft, nontender, nondistended, BS + x 4. MS: no deformity or atrophy. Skin: warm and dry, no rash. Neuro:  Strength and sensation are intact. Psych: Normal affect.  Accessory Clinical Findings    ECG personally reviewed by me today - No EKG in office today.   Lab Results  Component Value Date   WBC 13.9 (H) 08/21/2021   HGB 13.1 08/21/2021   HCT 39.2 08/21/2021   MCV 86.5 08/21/2021   PLT 361 08/21/2021   Lab Results  Component Value Date   CREATININE 1.70 (H) 08/21/2021   BUN 35 (H) 08/21/2021   NA 141 08/21/2021   K 4.0 08/21/2021   CL 102 08/21/2021   CO2 25 08/21/2021   Lab Results  Component Value Date   ALT 39 11/13/2019   AST 22 11/13/2019   ALKPHOS 52 11/13/2019   BILITOT 1.0 11/13/2019   Lab Results  Component Value Date   CHOL 79 11/08/2019   HDL 29 (L) 11/08/2019   LDLCALC 26 11/08/2019   TRIG 128 11/08/2019   CHOLHDL 2.7 11/08/2019    Lab Results  Component Value Date   HGBA1C 7.3 (H) 11/08/2019    Assessment & Plan    1. CAD: S/p CABG x3 in 2012. Lexiscan in 08/2021 was  low risk. Stable with no anginal symptoms. No indication for ischemic evaluation.  Continue aspirin, hydrochlorothiazide, losartan, metoprolol, Jardiance, and Lipitor.  2. Aortic stenosis: Mild on most recent echo.  He notes occasional mild dyspnea with significant exertion, otherwise asymptomatic. Consider repeat echo for continued monitoring in 08/2022.   3. Hypertension: BP well controlled. Continue current antihypertensive regimen.   4. Hyperlipidemia: LDL  was 61 in 04/2021. Continue ASA, Lipitor.   5. Type 2 diabetes: A1c was 7.0 in 01/2022. Monitored and managed per PCP.   6. Disposition: Follow-up in 6 months.      Lenna Sciara, NP 03/11/2022, 8:39 AM

## 2022-03-22 ENCOUNTER — Emergency Department (HOSPITAL_BASED_OUTPATIENT_CLINIC_OR_DEPARTMENT_OTHER): Payer: Medicare Other

## 2022-03-22 ENCOUNTER — Encounter (HOSPITAL_BASED_OUTPATIENT_CLINIC_OR_DEPARTMENT_OTHER): Payer: Self-pay | Admitting: Emergency Medicine

## 2022-03-22 ENCOUNTER — Emergency Department (HOSPITAL_BASED_OUTPATIENT_CLINIC_OR_DEPARTMENT_OTHER)
Admission: EM | Admit: 2022-03-22 | Discharge: 2022-03-22 | Disposition: A | Discharge status: Home / Self Care | Payer: Medicare Other | Attending: Emergency Medicine | Admitting: Emergency Medicine

## 2022-03-22 ENCOUNTER — Other Ambulatory Visit: Payer: Self-pay

## 2022-03-22 DIAGNOSIS — Z7982 Long term (current) use of aspirin: Secondary | ICD-10-CM | POA: Insufficient documentation

## 2022-03-22 DIAGNOSIS — K5730 Diverticulosis of large intestine without perforation or abscess without bleeding: Secondary | ICD-10-CM | POA: Diagnosis not present

## 2022-03-22 DIAGNOSIS — E1165 Type 2 diabetes mellitus with hyperglycemia: Secondary | ICD-10-CM | POA: Insufficient documentation

## 2022-03-22 DIAGNOSIS — I2510 Atherosclerotic heart disease of native coronary artery without angina pectoris: Secondary | ICD-10-CM | POA: Insufficient documentation

## 2022-03-22 DIAGNOSIS — I10 Essential (primary) hypertension: Secondary | ICD-10-CM | POA: Diagnosis not present

## 2022-03-22 DIAGNOSIS — Z79899 Other long term (current) drug therapy: Secondary | ICD-10-CM | POA: Diagnosis not present

## 2022-03-22 DIAGNOSIS — N1330 Unspecified hydronephrosis: Secondary | ICD-10-CM | POA: Diagnosis not present

## 2022-03-22 DIAGNOSIS — N201 Calculus of ureter: Secondary | ICD-10-CM

## 2022-03-22 DIAGNOSIS — Z7984 Long term (current) use of oral hypoglycemic drugs: Secondary | ICD-10-CM | POA: Insufficient documentation

## 2022-03-22 DIAGNOSIS — R1031 Right lower quadrant pain: Secondary | ICD-10-CM | POA: Diagnosis present

## 2022-03-22 HISTORY — DX: Unspecified malignant neoplasm of skin, unspecified: C44.90

## 2022-03-22 LAB — URINALYSIS, ROUTINE W REFLEX MICROSCOPIC
Bacteria, UA: NONE SEEN
Bilirubin Urine: NEGATIVE
Glucose, UA: >1000 mg/dL — AB
Ketones, ur: 15 mg/dL — AB
Leukocytes,Ua: NEGATIVE
Nitrite: NEGATIVE
Protein, ur: 30 mg/dL — AB
RBC / HPF: >50 RBC/hpf — ABNORMAL HIGH (ref 0–5)
Specific Gravity, Urine: 1.022 (ref 1.005–1.030)
pH: 5 (ref 5.0–8.0)

## 2022-03-22 LAB — LIPASE, BLOOD: Lipase: <10 U/L — ABNORMAL LOW (ref 11–51)

## 2022-03-22 LAB — COMPREHENSIVE METABOLIC PANEL
ALT: 12 U/L (ref 0–44)
AST: 14 U/L — ABNORMAL LOW (ref 15–41)
Albumin: 4.6 g/dL (ref 3.5–5.0)
Alkaline Phosphatase: 71 U/L (ref 38–126)
Anion gap: 17 — ABNORMAL HIGH (ref 5–15)
BUN: 20 mg/dL (ref 8–23)
CO2: 20 mmol/L — ABNORMAL LOW (ref 22–32)
Calcium: 9.8 mg/dL (ref 8.9–10.3)
Chloride: 102 mmol/L (ref 98–111)
Creatinine, Ser: 1.15 mg/dL (ref 0.61–1.24)
GFR, Estimated: >60 mL/min (ref >60)
Glucose, Bld: 192 mg/dL — ABNORMAL HIGH (ref 70–99)
Potassium: 3.8 mmol/L (ref 3.5–5.1)
Sodium: 139 mmol/L (ref 135–145)
Total Bilirubin: 0.8 mg/dL (ref 0.3–1.2)
Total Protein: 7.9 g/dL (ref 6.5–8.1)

## 2022-03-22 LAB — CBC
HCT: 46.4 % (ref 39.0–52.0)
Hemoglobin: 15.9 g/dL (ref 13.0–17.0)
MCH: 29.3 pg (ref 26.0–34.0)
MCHC: 34.3 g/dL (ref 30.0–36.0)
MCV: 85.6 fL (ref 80.0–100.0)
Platelets: 276 K/uL (ref 150–400)
RBC: 5.42 MIL/uL (ref 4.22–5.81)
RDW: 14.1 % (ref 11.5–15.5)
WBC: 18.4 K/uL — ABNORMAL HIGH (ref 4.0–10.5)
nRBC: 0 % (ref 0.0–0.2)

## 2022-03-22 LAB — CBG MONITORING, ED: Glucose-Capillary: 169 mg/dL — ABNORMAL HIGH (ref 70–99)

## 2022-03-22 MED ORDER — KETOROLAC TROMETHAMINE 30 MG/ML IJ SOLN
30.0000 mg | Freq: Once | INTRAMUSCULAR | Status: AC
  Administered 2022-03-22: 30 mg via INTRAMUSCULAR
  Filled 2022-03-22: qty 1

## 2022-03-22 MED ORDER — TAMSULOSIN HCL 0.4 MG PO CAPS: 0.4000 mg | ORAL_CAPSULE | Freq: Every day | ORAL | 0 refills | Status: AC

## 2022-03-22 MED ORDER — ONDANSETRON HCL 4 MG PO TABS: 4.0000 mg | ORAL_TABLET | Freq: Four times a day (QID) | ORAL | 0 refills | Status: AC

## 2022-03-22 MED ORDER — SODIUM CHLORIDE 0.9 % IV BOLUS
1000.0000 mL | Freq: Once | INTRAVENOUS | Status: AC
  Administered 2022-03-22: 1000 mL via INTRAVENOUS

## 2022-03-22 MED ORDER — OXYCODONE HCL 5 MG PO TABS: 5.0000 mg | ORAL_TABLET | ORAL | 0 refills | Status: AC | PRN

## 2022-03-22 MED ORDER — ONDANSETRON 4 MG PO TBDP
4.0000 mg | ORAL_TABLET | Freq: Once | ORAL | Status: AC | PRN
  Administered 2022-03-22: 4 mg via ORAL
  Filled 2022-03-22: qty 1

## 2022-03-22 NOTE — ED Provider Notes (Signed)
[facility] EMERGENCY DEPT Provider Note   CSN: [id number] Arrival date & time: 03/22/22  1442     History  Chief Complaint  Patient presents with   Abdominal Pain    Mathew Clements is a 77 y.o. male.  Patient presents the emergency room complaining of lower right-sided abdominal pain and flank pain and nausea, vomiting, diarrhea which began yesterday.  Patient states he has a remote history of kidney stones and that this feels similar to his previous stone.  Patient denies chest pain, shortness of breath, fevers.  Past medical history significant for hypertension, history of kidney stones, type II DM, CAD, previous MI  HPI     Home Medications Prior to Admission medications   Medication Sig Start Date End Date Taking? Authorizing Provider  ondansetron (ZOFRAN) 4 MG tablet Take 1 tablet (4 mg total) by mouth every 6 (six) hours. 03/22/22  Yes Dorothyann Peng, PA-C  oxyCODONE (ROXICODONE) 5 MG immediate release tablet Take 1 tablet (5 mg total) by mouth every 4 (four) hours as needed for severe pain. 03/22/22  Yes Dorothyann Peng, PA-C  tamsulosin (FLOMAX) 0.4 MG CAPS capsule Take 1 capsule (0.4 mg total) by mouth daily. 03/22/22 04/21/22 Yes Dorothyann Peng, PA-C  amLODipine (NORVASC) 10 MG tablet Take 10 mg by mouth daily.    [provider]  aspirin 81 MG EC tablet Take 81 mg by mouth daily. Swallow whole.    [provider]  aspirin EC (BAYER ASPIRIN EC LOW DOSE) 81 MG tablet 81 mg.    [provider]  atorvastatin (LIPITOR) 40 MG tablet Take 40 mg by mouth daily.    [provider]  chlorpheniramine-HYDROcodone (TUSSIONEX) 10-8 MG/5ML SUER Take 5 mLs by mouth every 12 (twelve) hours as needed for cough. 11/13/19   Caren Griffins, MD  Cyanocobalamin (VITAMIN B12) 1000 MCG TBCR 1,000 mcg.    [provider]  dexamethasone (DECADRON) 6 MG tablet Take 1 tablet (6 mg total) by mouth daily. 11/13/19   Caren Griffins, MD   empagliflozin (JARDIANCE) 10 MG TABS tablet 10 mg.    [provider]  glipiZIDE (GLUCOTROL XL) 10 MG 24 hr tablet Take 10 mg by mouth daily with breakfast.    [provider]  guaiFENesin-dextromethorphan (ROBITUSSIN DM) 100-10 MG/5ML syrup Take 10 mLs by mouth every 4 (four) hours as needed for cough. 11/13/19   Caren Griffins, MD  hydrochlorothiazide (HYDRODIURIL) 25 MG tablet Take 1 tablet by mouth every morning. 08/05/20   [provider]  loratadine (CLARITIN) 10 MG tablet Take 10 mg by mouth daily. 08/13/21   [provider]  losartan (COZAAR) 100 MG tablet Take 100 mg by mouth daily.    [provider]  metFORMIN (GLUCOPHAGE) 1000 MG tablet Take 1,000 mg by mouth 2 (two) times daily with a meal.    [provider]  methocarbamol (ROBAXIN) 750 MG tablet Take 750 mg by mouth as needed for muscle spasms.    [provider]  metoprolol tartrate (LOPRESSOR) 25 MG tablet Take 25 mg by mouth 2 (two) times daily.    [provider]  nitroGLYCERIN (NITROSTAT) 0.4 MG SL tablet PLACE 1 TABLET UNDER THE TONGUE EVERY 5 MINUTES AS NEEDED FOR CHEST PAIN. 04/04/20   Minus Breeding, MD  ROCKLATAN 0.02-0.005 % SOLN Place 1 drop into both eyes daily. 10/02/19   [provider]  sertraline (ZOLOFT) 25 MG tablet Take 25 mg by mouth daily. 03/08/22  [provider]  timolol (TIMOPTIC) 0.5 % ophthalmic solution 1 drop every morning. 08/19/20   [provider]      Allergies    Patient has no known allergies.    Review of Systems   Review of Systems  Gastrointestinal:  Positive for abdominal pain, diarrhea, nausea and vomiting.  Genitourinary:  Positive for flank pain. Negative for dysuria.    Physical Exam Updated Vital Signs BP (!) 142/77 (BP Location: Right Arm)   Pulse (!) 101   Temp 98.2 F (36.8 C) (Oral)   Resp 16   SpO2 98%  Physical Exam Vitals and nursing note reviewed.  Constitutional:       General: He is not in acute distress.    Appearance: He is well-developed.  HENT:     Head: Normocephalic and atraumatic.  Eyes:     Conjunctiva/sclera: Conjunctivae normal.  Cardiovascular:     Rate and Rhythm: Normal rate and regular rhythm.     Heart sounds: No murmur heard. Pulmonary:     Effort: Pulmonary effort is normal. No respiratory distress.     Breath sounds: Normal breath sounds.  Abdominal:     Palpations: Abdomen is soft.     Tenderness: There is abdominal tenderness in the right lower quadrant. There is right CVA tenderness.  Musculoskeletal:        General: No swelling.     Cervical back: Neck supple.  Skin:    General: Skin is warm and dry.     Capillary Refill: Capillary refill takes less than 2 seconds.  Neurological:     Mental Status: He is alert.  Psychiatric:        Mood and Affect: Mood normal.     ED Results / Procedures / Treatments   Labs (all labs ordered are listed, but only abnormal results are displayed) Labs Reviewed  LIPASE, BLOOD - Abnormal; Notable for the following components:      Result Value   Lipase <10 (*)    All other components within normal limits  COMPREHENSIVE METABOLIC PANEL - Abnormal; Notable for the following components:   CO2 20 (*)    Glucose, Bld 192 (*)    AST 14 (*)    Anion gap 17 (*)    All other components within normal limits  CBC - Abnormal; Notable for the following components:   WBC 18.4 (*)    All other components within normal limits  URINALYSIS, ROUTINE W REFLEX MICROSCOPIC - Abnormal; Notable for the following components:   Glucose, UA >1,000 (*)    Hgb urine dipstick LARGE (*)    Ketones, ur 15 (*)    Protein, ur 30 (*)    RBC / HPF >50 (*)    All other components within normal limits  CBG MONITORING, ED - Abnormal; Notable for the following components:   Glucose-Capillary 169 (*)    All other components within normal limits    EKG None  Radiology CT ABDOMEN PELVIS WO CONTRAST  Result  Date: 03/22/2022 CLINICAL DATA:  Lower abdominal pain EXAM: CT ABDOMEN AND PELVIS WITHOUT CONTRAST TECHNIQUE: Multidetector CT imaging of the abdomen and pelvis was performed following the standard protocol without IV contrast. RADIATION DOSE REDUCTION: This exam was performed according to the departmental dose-optimization program which includes automated exposure control, adjustment of the mA and/or kV according to patient size and/or use of iterative reconstruction technique. COMPARISON:  10/09/2021 FINDINGS: Lower chest: Atheromatous changes coronary arteries. Dependent bibasilar linear subsegmental atelectasis or  scarring. No pleural or pericardial effusion. Hepatobiliary: Layering milk of calcium in the gallbladder. No pericholecystic inflammatory changes. No lesions identified in the liver. Pancreas: Unremarkable. Cystic lesion observed on prior study is not appreciated. Spleen: Normal in size without focal abnormality. Adrenals/Urinary Tract: Adrenal glands are unremarkable. Left kidney is normal, without renal calculi, focal lesion, or hydronephrosis. On the right there is hydronephrosis with perinephric stranding. The right ureter is dilated into the pelvis where there is a distal right ureteral 7 mm stone. Bladder is unremarkable. Stomach/Bowel: Stomach is within normal limits. Appendix appears normal. No evidence of bowel wall thickening, distention, or inflammatory changes. Scattered sigmoid diverticula. Vascular/Lymphatic: Aortic atherosclerosis. No enlarged abdominal or pelvic lymph nodes. Reproductive: Prostate appears prominent. Other: No abdominal wall hernia or abnormality. No abdominopelvic ascites. Musculoskeletal: No acute or significant osseous findings. Multilevel thoracolumbosacral degenerative changes. IMPRESSION: 1. Distal right ureteral 7 mm stone with obstruction. 2. Additional stable nonacute findings as described above. Electronically Signed   By: Sammie Bench M.D.   On: 03/22/2022  17:21    Procedures Procedures    Medications Ordered in ED Medications  ondansetron (ZOFRAN-ODT) disintegrating tablet 4 mg (4 mg Oral Given 03/22/22 1514)  ketorolac (TORADOL) 30 MG/ML injection 30 mg (30 mg Intramuscular Given 03/22/22 1631)  sodium chloride 0.9 % bolus 1,000 mL (1,000 mLs Intravenous New Bag/Given 03/22/22 1809)    ED Course/ Medical Decision Making/ A&P Clinical Course as of 03/22/22 1905  Mon Mar 22, 2022  1730 Stable 92m stone [CC]  1730 WBC a stress response [CC]    Clinical Course User Index [CC] CTretha Sciara MD                           Medical Decision Making Amount and/or Complexity of Data Reviewed Labs: ordered. Radiology: ordered.  Risk Prescription drug management.   This patient presents to the ED for concern of abdominal and flank pain, this involves an extensive number of treatment options, and is a complaint that carries with it a high risk of complications and morbidity.  The differential diagnosis includes nephrolithiasis, pyelonephritis, acute cystitis, appendicitis, cholecystitis, and others   Co morbidities that complicate the patient evaluation  History hypertension, type II DM, CAD, Bell's palsy, anemia   Additional history obtained:  Additional history obtained from family at bedside External records from outside source obtained and reviewed including notes from cardiology due to recent visit for evaluation of coronary artery disease   Lab Tests:  I Ordered, and personally interpreted labs.  The pertinent results include: WBC 18.4 (likely reactive), grossly unremarkable CMP, urinalysis with large hemoglobin, greater than thousand glucose, ketones, protein; unremarkable lipase, capillary glucose 169   Imaging Studies ordered:  I ordered imaging studies including CT abdomen pelvis without contrast I independently visualized and interpreted imaging which showed  1. Distal right ureteral 7 mm stone with obstruction.  2.  Additional stable nonacute findings as described above   I agree with the radiologist interpretation  Problem List / ED Course / Critical interventions / Medication management   I ordered medication including Toradol for pain, Zofran for nausea, normal saline for rehydration Reevaluation of the patient after these medicines showed that the patient improved I have reviewed the patients home medicines and have made adjustments as needed    Test / Admission - Considered:  Patient is a 7 mm stone which may be amenable to conservative treatment at this time.  Plan to discharge patient  home with pain medication, Zofran, Flomax and have him take scheduled Tylenol and ibuprofen.  Patient may need further urologic intervention but will try hydrating at home first.  Return precautions provided included inability to urinate, inability to tolerate p.o. liquids, intractable pain        Final Clinical Impression(s) / ED Diagnoses Final diagnoses:  Right ureteral stone    Rx / DC Orders ED Discharge Orders          Ordered    tamsulosin (FLOMAX) 0.4 MG CAPS capsule  Daily        03/22/22 1746    oxyCODONE (ROXICODONE) 5 MG immediate release tablet  Every 4 hours PRN        03/22/22 1746    ondansetron (ZOFRAN) 4 MG tablet  Every 6 hours        03/22/22 1746              Ronny Bacon 03/22/22 Deloris Ping, MD 03/22/22 2028

## 2022-03-22 NOTE — ED Triage Notes (Signed)
Lower abdominal pain and n/v/d since yesterday. "Feels like last time when I had stones"

## 2022-03-22 NOTE — Discharge Instructions (Addendum)
You were diagnosed today with a kidney stone. This will hopefully pass without further intervention. Please take 1000 mg of Tylenol every 6 hours and 600 mg of ibuprofen every 6 hours.  The Roxicodone I prescribed is for breakthrough pain.  Please take the Zofran as scheduled for your nausea.  Please take the Flomax daily.  Follow-up as needed with urology for further intervention for this kidney stone.

## 2022-03-22 NOTE — ED Notes (Signed)
Called x2, pt in w/r b/r

## 2022-03-25 DIAGNOSIS — N201 Calculus of ureter: Secondary | ICD-10-CM | POA: Diagnosis not present

## 2022-03-30 DIAGNOSIS — C44329 Squamous cell carcinoma of skin of other parts of face: Secondary | ICD-10-CM | POA: Diagnosis not present

## 2022-04-08 DIAGNOSIS — N201 Calculus of ureter: Secondary | ICD-10-CM | POA: Diagnosis not present

## 2022-04-13 DIAGNOSIS — D485 Neoplasm of uncertain behavior of skin: Secondary | ICD-10-CM | POA: Diagnosis not present

## 2022-04-13 DIAGNOSIS — C44529 Squamous cell carcinoma of skin of other part of trunk: Secondary | ICD-10-CM | POA: Diagnosis not present

## 2022-04-13 DIAGNOSIS — D045 Carcinoma in situ of skin of trunk: Secondary | ICD-10-CM | POA: Diagnosis not present

## 2022-05-20 DIAGNOSIS — N201 Calculus of ureter: Secondary | ICD-10-CM | POA: Diagnosis not present

## 2022-06-03 DIAGNOSIS — H40053 Ocular hypertension, bilateral: Secondary | ICD-10-CM | POA: Diagnosis not present

## 2022-06-03 DIAGNOSIS — H2513 Age-related nuclear cataract, bilateral: Secondary | ICD-10-CM | POA: Diagnosis not present

## 2022-06-03 DIAGNOSIS — H40023 Open angle with borderline findings, high risk, bilateral: Secondary | ICD-10-CM | POA: Diagnosis not present

## 2022-06-09 ENCOUNTER — Other Ambulatory Visit: Payer: Self-pay | Admitting: Physician Assistant

## 2022-06-09 DIAGNOSIS — R935 Abnormal findings on diagnostic imaging of other abdominal regions, including retroperitoneum: Secondary | ICD-10-CM

## 2022-06-30 DIAGNOSIS — M79604 Pain in right leg: Secondary | ICD-10-CM | POA: Diagnosis not present

## 2022-07-01 ENCOUNTER — Ambulatory Visit (HOSPITAL_COMMUNITY)
Admission: RE | Admit: 2022-07-01 | Discharge: 2022-07-01 | Disposition: A | Discharge status: Home / Self Care | Payer: Medicare Other | Source: Ambulatory Visit | Attending: Cardiology | Admitting: Cardiology

## 2022-07-01 ENCOUNTER — Other Ambulatory Visit (HOSPITAL_COMMUNITY): Payer: Self-pay | Admitting: Physician Assistant

## 2022-07-01 DIAGNOSIS — M79604 Pain in right leg: Secondary | ICD-10-CM

## 2022-07-03 ENCOUNTER — Ambulatory Visit
Admission: RE | Admit: 2022-07-03 | Discharge: 2022-07-03 | Disposition: A | Discharge status: Home / Self Care | Payer: Medicare Other | Source: Ambulatory Visit | Attending: Physician Assistant | Admitting: Physician Assistant

## 2022-07-03 DIAGNOSIS — K862 Cyst of pancreas: Secondary | ICD-10-CM | POA: Diagnosis not present

## 2022-07-03 DIAGNOSIS — R935 Abnormal findings on diagnostic imaging of other abdominal regions, including retroperitoneum: Secondary | ICD-10-CM

## 2022-07-03 DIAGNOSIS — K8020 Calculus of gallbladder without cholecystitis without obstruction: Secondary | ICD-10-CM | POA: Diagnosis not present

## 2022-07-03 MED ORDER — GADOPICLENOL 0.5 MMOL/ML IV SOLN
7.5000 mL | Freq: Once | INTRAVENOUS | Status: AC | PRN
  Administered 2022-07-03: 7.5 mL via INTRAVENOUS

## 2022-07-08 DIAGNOSIS — M79661 Pain in right lower leg: Secondary | ICD-10-CM | POA: Diagnosis not present

## 2022-07-08 DIAGNOSIS — M79604 Pain in right leg: Secondary | ICD-10-CM | POA: Diagnosis not present

## 2022-07-22 DIAGNOSIS — M79661 Pain in right lower leg: Secondary | ICD-10-CM | POA: Diagnosis not present

## 2022-07-22 DIAGNOSIS — M79604 Pain in right leg: Secondary | ICD-10-CM | POA: Diagnosis not present

## 2022-08-24 DIAGNOSIS — D225 Melanocytic nevi of trunk: Secondary | ICD-10-CM | POA: Diagnosis not present

## 2022-08-24 DIAGNOSIS — C44629 Squamous cell carcinoma of skin of left upper limb, including shoulder: Secondary | ICD-10-CM | POA: Diagnosis not present

## 2022-08-24 DIAGNOSIS — Z08 Encounter for follow-up examination after completed treatment for malignant neoplasm: Secondary | ICD-10-CM | POA: Diagnosis not present

## 2022-08-24 DIAGNOSIS — C4442 Squamous cell carcinoma of skin of scalp and neck: Secondary | ICD-10-CM | POA: Diagnosis not present

## 2022-08-24 DIAGNOSIS — L814 Other melanin hyperpigmentation: Secondary | ICD-10-CM | POA: Diagnosis not present

## 2022-08-24 DIAGNOSIS — Z85828 Personal history of other malignant neoplasm of skin: Secondary | ICD-10-CM | POA: Diagnosis not present

## 2022-08-24 DIAGNOSIS — D485 Neoplasm of uncertain behavior of skin: Secondary | ICD-10-CM | POA: Diagnosis not present

## 2022-08-24 DIAGNOSIS — L570 Actinic keratosis: Secondary | ICD-10-CM | POA: Diagnosis not present

## 2022-08-24 DIAGNOSIS — L821 Other seborrheic keratosis: Secondary | ICD-10-CM | POA: Diagnosis not present

## 2022-09-07 DIAGNOSIS — L989 Disorder of the skin and subcutaneous tissue, unspecified: Secondary | ICD-10-CM | POA: Diagnosis not present

## 2022-09-07 DIAGNOSIS — C44629 Squamous cell carcinoma of skin of left upper limb, including shoulder: Secondary | ICD-10-CM | POA: Diagnosis not present

## 2022-09-09 DIAGNOSIS — R011 Cardiac murmur, unspecified: Secondary | ICD-10-CM | POA: Insufficient documentation

## 2022-09-09 NOTE — Progress Notes (Signed)
  Cardiology Office Note:   Date:  09/10/2022  ID:  Mathew Clements, DOB 06-Jan-1946, MRN [id number] PCP: Aliene Beams, MD  [facility] Providers Cardiologist:  Rollene Rotunda, MD {  History of Present Illness:   Mathew Clements is a 77 y.o. male with the above past medical history including CAD s/p CABG x 3 in 2012, mild aortic stenosis, hypertension, hyperlipidemia, and type 2 diabetes.   He underwent CABG x 3 (LIMA-LAD, SVG-PDA, SVG-D1) in 2012.  He presented to the ED in 08/2021 with chest pain.  Troponin was negative, EKG was without acute changes.  He was last seen in the office on 09/15/2021 noted ongoing intermittent chest discomfort.  EKG demonstrated new anterior Q waves.  Echocardiogram showed EF 60 to 65%, normal LV function, G1 DD, normal RV systolic function, mild aortic stenosis.  Lexiscan Myoview was low risk.  He presents today for follow-up he has done well.  He stays active. The patient denies any new symptoms such as chest discomfort, neck or arm discomfort. There has been no new shortness of breath, PND or orthopnea. There have been no reported palpitations, presyncope or syncope.     ROS: As stated in the HPI and negative for all other systems.  Studies Reviewed:    EKG:   EKG Interpretation  Date/Time:  Friday September 10 2022 07:48:08 EDT Ventricular Rate:  58 PR Interval:  212 QRS Duration: 86 QT Interval:  420 QTC Calculation: 412 R Axis:   57 Text Interpretation: Sinus bradycardia with 1st degree A-V block Low voltage QRS Poor anterior R wave progression. When compared with ECG of 21-Aug-2021 12:03, Confirmed by Rollene Rotunda ([id number]) on 09/10/2022 7:50:13 AM    Risk Assessment/Calculations:              Physical Exam:   VS:  BP 128/64   Pulse (!) 58   Ht 5\' 10"  (1.778 m)   Wt 197 lb 3.2 oz (89.4 kg)   BMI 28.30 kg/m    Wt Readings from Last 3 Encounters:  09/10/22 197 lb 3.2 oz (89.4 kg)  03/11/22 194 lb (88 kg)  10/06/21 198 lb (89.8 kg)      GEN: Well nourished, well developed in no acute distress NECK: No JVD; No carotid bruits CARDIAC: RRR, 2/6 apical systolic murmur, no diastolic  murmurs, rubs, gallops RESPIRATORY:  Clear to auscultation without rales, wheezing or rhonchi  ABDOMEN: Soft, non-tender, non-distended EXTREMITIES:  No edema; No deformity   ASSESSMENT AND PLAN:   CAD (coronary artery disease):  The patient has no new sypmtoms.  No further cardiovascular testing is indicated.  We will continue with aggressive risk reduction and meds as listed.   AS:  This was very mild.  No change in therapy.    HTN (hypertension):  The blood pressure is at target. No change in medications is indicated. We will continue with therapeutic lifestyle changes (TLC).   Hyperlipidemia:  I will check a lipid profile and LP(a).   DM: A1c was 7.0 which is down from 7.7.  No change in therapy and plans per his primary.  He understands the goal will be around 6-6-1/2.     Follow up me in one year.   Signed, Rollene Rotunda, MD

## 2022-09-10 ENCOUNTER — Ambulatory Visit: Payer: Medicare Other | Attending: Cardiology | Admitting: Cardiology

## 2022-09-10 ENCOUNTER — Encounter: Payer: Self-pay | Admitting: Cardiology

## 2022-09-10 VITALS — BP 128/64 | HR 58 | Ht 70.0 in | Wt 197.2 lb

## 2022-09-10 DIAGNOSIS — E118 Type 2 diabetes mellitus with unspecified complications: Secondary | ICD-10-CM

## 2022-09-10 DIAGNOSIS — R011 Cardiac murmur, unspecified: Secondary | ICD-10-CM

## 2022-09-10 DIAGNOSIS — E785 Hyperlipidemia, unspecified: Secondary | ICD-10-CM

## 2022-09-10 DIAGNOSIS — I10 Essential (primary) hypertension: Secondary | ICD-10-CM

## 2022-09-10 DIAGNOSIS — Z7984 Long term (current) use of oral hypoglycemic drugs: Secondary | ICD-10-CM

## 2022-09-10 DIAGNOSIS — I2510 Atherosclerotic heart disease of native coronary artery without angina pectoris: Secondary | ICD-10-CM | POA: Diagnosis not present

## 2022-09-10 NOTE — Patient Instructions (Signed)
   Follow-Up: At [facility], you and your health needs are our priority.  As part of our continuing mission to provide you with exceptional heart care, we have created designated Provider Care Teams.  These Care Teams include your primary Cardiologist (physician) and Advanced Practice Providers (APPs -  Physician Assistants and Nurse Practitioners) who all work together to provide you with the care you need, when you need it.  We recommend signing up for the patient portal called "MyChart".  Sign up information is provided on this After Visit Summary.  MyChart is used to connect with patients for Virtual Visits (Telemedicine).  Patients are able to view lab/test results, encounter notes, upcoming appointments, etc.  Non-urgent messages can be sent to your provider as well.   To learn more about what you can do with MyChart, go to [email or web address].    Your next appointment:   12 month(s)  Provider:   James Hochrein, MD      

## 2022-09-13 LAB — LIPID PANEL
Chol/HDL Ratio: 3 ratio (ref 0.0–5.0)
Cholesterol, Total: 121 mg/dL (ref 100–199)
HDL: 40 mg/dL (ref >39)
LDL Chol Calc (NIH): 62 mg/dL (ref 0–99)
Triglycerides: 100 mg/dL (ref 0–149)
VLDL Cholesterol Cal: 19 mg/dL (ref 5–40)

## 2022-09-13 LAB — LIPOPROTEIN A (LPA): Lipoprotein (a): 180.9 nmol/L — ABNORMAL HIGH (ref <75.0)

## 2022-09-14 ENCOUNTER — Telehealth: Payer: Self-pay | Admitting: *Deleted

## 2022-09-14 DIAGNOSIS — E785 Hyperlipidemia, unspecified: Secondary | ICD-10-CM

## 2022-09-14 MED ORDER — ATORVASTATIN CALCIUM 80 MG PO TABS: 80.0000 mg | ORAL_TABLET | Freq: Every day | ORAL | 3 refills | Status: DC

## 2022-09-14 NOTE — Telephone Encounter (Signed)
Spoke with pt, aware of recommendations. New script sent to the pharmacy  Lab orders mailed to the pt  

## 2022-09-14 NOTE — Telephone Encounter (Signed)
-----   Message from Rollene Rotunda, MD sent at 09/12/2022  4:09 PM EDT ----- LDL is mildly elevated.  I would like to increase the Lipitor to 80 mg po daily.  Call Mr. Bethel with the results and send results to Aliene Beams, MD.  Romie Minus repeat a lipid profile in 3 months.

## 2022-10-05 DIAGNOSIS — C4442 Squamous cell carcinoma of skin of scalp and neck: Secondary | ICD-10-CM | POA: Diagnosis not present

## 2022-11-11 DIAGNOSIS — S0120XA Unspecified open wound of nose, initial encounter: Secondary | ICD-10-CM | POA: Diagnosis not present

## 2022-11-18 DIAGNOSIS — N201 Calculus of ureter: Secondary | ICD-10-CM | POA: Diagnosis not present

## 2022-11-30 DIAGNOSIS — I10 Essential (primary) hypertension: Secondary | ICD-10-CM | POA: Diagnosis not present

## 2022-11-30 DIAGNOSIS — E538 Deficiency of other specified B group vitamins: Secondary | ICD-10-CM | POA: Diagnosis not present

## 2022-11-30 DIAGNOSIS — E782 Mixed hyperlipidemia: Secondary | ICD-10-CM | POA: Diagnosis not present

## 2022-11-30 DIAGNOSIS — I700 Atherosclerosis of aorta: Secondary | ICD-10-CM | POA: Diagnosis not present

## 2022-11-30 DIAGNOSIS — I2510 Atherosclerotic heart disease of native coronary artery without angina pectoris: Secondary | ICD-10-CM | POA: Diagnosis not present

## 2022-11-30 DIAGNOSIS — Z79899 Other long term (current) drug therapy: Secondary | ICD-10-CM | POA: Diagnosis not present

## 2022-11-30 DIAGNOSIS — Z0000 Encounter for general adult medical examination without abnormal findings: Secondary | ICD-10-CM | POA: Diagnosis not present

## 2022-11-30 DIAGNOSIS — Z23 Encounter for immunization: Secondary | ICD-10-CM | POA: Diagnosis not present

## 2022-11-30 DIAGNOSIS — E1169 Type 2 diabetes mellitus with other specified complication: Secondary | ICD-10-CM | POA: Diagnosis not present

## 2022-11-30 DIAGNOSIS — E1142 Type 2 diabetes mellitus with diabetic polyneuropathy: Secondary | ICD-10-CM | POA: Diagnosis not present

## 2022-12-07 DIAGNOSIS — E538 Deficiency of other specified B group vitamins: Secondary | ICD-10-CM | POA: Diagnosis not present

## 2022-12-09 DIAGNOSIS — H0102B Squamous blepharitis left eye, upper and lower eyelids: Secondary | ICD-10-CM | POA: Diagnosis not present

## 2022-12-09 DIAGNOSIS — E119 Type 2 diabetes mellitus without complications: Secondary | ICD-10-CM | POA: Diagnosis not present

## 2022-12-09 DIAGNOSIS — H40053 Ocular hypertension, bilateral: Secondary | ICD-10-CM | POA: Diagnosis not present

## 2022-12-09 DIAGNOSIS — H2513 Age-related nuclear cataract, bilateral: Secondary | ICD-10-CM | POA: Diagnosis not present

## 2022-12-09 DIAGNOSIS — H40023 Open angle with borderline findings, high risk, bilateral: Secondary | ICD-10-CM | POA: Diagnosis not present

## 2022-12-09 DIAGNOSIS — H0102A Squamous blepharitis right eye, upper and lower eyelids: Secondary | ICD-10-CM | POA: Diagnosis not present

## 2023-01-04 DIAGNOSIS — E538 Deficiency of other specified B group vitamins: Secondary | ICD-10-CM | POA: Diagnosis not present

## 2023-01-11 ENCOUNTER — Encounter: Payer: Self-pay | Admitting: *Deleted

## 2023-01-18 DIAGNOSIS — E785 Hyperlipidemia, unspecified: Secondary | ICD-10-CM | POA: Diagnosis not present

## 2023-01-19 LAB — LIPID PANEL
Chol/HDL Ratio: 2.5 {ratio} (ref 0.0–5.0)
Cholesterol, Total: 107 mg/dL (ref 100–199)
HDL: 42 mg/dL (ref >39)
LDL Chol Calc (NIH): 50 mg/dL (ref 0–99)
Triglycerides: 71 mg/dL (ref 0–149)
VLDL Cholesterol Cal: 15 mg/dL (ref 5–40)

## 2023-01-27 ENCOUNTER — Encounter: Payer: Self-pay | Admitting: *Deleted

## 2023-02-08 DIAGNOSIS — E538 Deficiency of other specified B group vitamins: Secondary | ICD-10-CM | POA: Diagnosis not present

## 2023-02-24 DIAGNOSIS — D225 Melanocytic nevi of trunk: Secondary | ICD-10-CM | POA: Diagnosis not present

## 2023-02-24 DIAGNOSIS — L814 Other melanin hyperpigmentation: Secondary | ICD-10-CM | POA: Diagnosis not present

## 2023-02-24 DIAGNOSIS — L570 Actinic keratosis: Secondary | ICD-10-CM | POA: Diagnosis not present

## 2023-02-24 DIAGNOSIS — Z08 Encounter for follow-up examination after completed treatment for malignant neoplasm: Secondary | ICD-10-CM | POA: Diagnosis not present

## 2023-02-24 DIAGNOSIS — Z85828 Personal history of other malignant neoplasm of skin: Secondary | ICD-10-CM | POA: Diagnosis not present

## 2023-02-24 DIAGNOSIS — L821 Other seborrheic keratosis: Secondary | ICD-10-CM | POA: Diagnosis not present

## 2023-05-22 ENCOUNTER — Other Ambulatory Visit: Payer: Self-pay | Admitting: Cardiology

## 2023-05-22 DIAGNOSIS — E785 Hyperlipidemia, unspecified: Secondary | ICD-10-CM

## 2023-05-25 DIAGNOSIS — N201 Calculus of ureter: Secondary | ICD-10-CM | POA: Diagnosis not present

## 2023-06-06 DIAGNOSIS — I2510 Atherosclerotic heart disease of native coronary artery without angina pectoris: Secondary | ICD-10-CM | POA: Diagnosis not present

## 2023-06-06 DIAGNOSIS — E782 Mixed hyperlipidemia: Secondary | ICD-10-CM | POA: Diagnosis not present

## 2023-06-06 DIAGNOSIS — E1169 Type 2 diabetes mellitus with other specified complication: Secondary | ICD-10-CM | POA: Diagnosis not present

## 2023-06-06 DIAGNOSIS — E538 Deficiency of other specified B group vitamins: Secondary | ICD-10-CM | POA: Diagnosis not present

## 2023-06-06 DIAGNOSIS — I10 Essential (primary) hypertension: Secondary | ICD-10-CM | POA: Diagnosis not present

## 2023-06-06 DIAGNOSIS — I739 Peripheral vascular disease, unspecified: Secondary | ICD-10-CM | POA: Diagnosis not present

## 2023-06-06 DIAGNOSIS — R0683 Snoring: Secondary | ICD-10-CM | POA: Diagnosis not present

## 2023-06-09 DIAGNOSIS — H40053 Ocular hypertension, bilateral: Secondary | ICD-10-CM | POA: Diagnosis not present

## 2023-06-09 DIAGNOSIS — H0102A Squamous blepharitis right eye, upper and lower eyelids: Secondary | ICD-10-CM | POA: Diagnosis not present

## 2023-06-09 DIAGNOSIS — H0102B Squamous blepharitis left eye, upper and lower eyelids: Secondary | ICD-10-CM | POA: Diagnosis not present

## 2023-06-09 DIAGNOSIS — H2513 Age-related nuclear cataract, bilateral: Secondary | ICD-10-CM | POA: Diagnosis not present

## 2023-06-22 ENCOUNTER — Other Ambulatory Visit: Payer: Self-pay | Admitting: Gastroenterology

## 2023-06-22 DIAGNOSIS — R935 Abnormal findings on diagnostic imaging of other abdominal regions, including retroperitoneum: Secondary | ICD-10-CM

## 2023-06-28 DIAGNOSIS — R0683 Snoring: Secondary | ICD-10-CM | POA: Diagnosis not present

## 2023-06-28 DIAGNOSIS — I2510 Atherosclerotic heart disease of native coronary artery without angina pectoris: Secondary | ICD-10-CM | POA: Diagnosis not present

## 2023-06-28 DIAGNOSIS — I10 Essential (primary) hypertension: Secondary | ICD-10-CM | POA: Diagnosis not present

## 2023-07-06 DIAGNOSIS — G4733 Obstructive sleep apnea (adult) (pediatric): Secondary | ICD-10-CM | POA: Diagnosis not present

## 2023-07-18 ENCOUNTER — Other Ambulatory Visit: Payer: Self-pay | Admitting: Cardiology

## 2023-07-18 DIAGNOSIS — E785 Hyperlipidemia, unspecified: Secondary | ICD-10-CM

## 2023-07-19 DIAGNOSIS — I2510 Atherosclerotic heart disease of native coronary artery without angina pectoris: Secondary | ICD-10-CM | POA: Diagnosis not present

## 2023-07-19 DIAGNOSIS — G4733 Obstructive sleep apnea (adult) (pediatric): Secondary | ICD-10-CM | POA: Diagnosis not present

## 2023-07-19 DIAGNOSIS — I10 Essential (primary) hypertension: Secondary | ICD-10-CM | POA: Diagnosis not present

## 2023-08-02 ENCOUNTER — Ambulatory Visit
Admission: RE | Admit: 2023-08-02 | Discharge: 2023-08-02 | Disposition: A | Discharge status: Home / Self Care | Source: Ambulatory Visit | Attending: Gastroenterology | Admitting: Gastroenterology

## 2023-08-02 DIAGNOSIS — K8020 Calculus of gallbladder without cholecystitis without obstruction: Secondary | ICD-10-CM | POA: Diagnosis not present

## 2023-08-02 DIAGNOSIS — R935 Abnormal findings on diagnostic imaging of other abdominal regions, including retroperitoneum: Secondary | ICD-10-CM

## 2023-08-02 DIAGNOSIS — K862 Cyst of pancreas: Secondary | ICD-10-CM | POA: Diagnosis not present

## 2023-08-02 MED ORDER — GADOPICLENOL 0.5 MMOL/ML IV SOLN
9.0000 mL | Freq: Once | INTRAVENOUS | Status: AC | PRN
  Administered 2023-08-02: 9 mL via INTRAVENOUS

## 2023-08-25 DIAGNOSIS — S80862A Insect bite (nonvenomous), left lower leg, initial encounter: Secondary | ICD-10-CM | POA: Diagnosis not present

## 2023-08-25 DIAGNOSIS — Z85828 Personal history of other malignant neoplasm of skin: Secondary | ICD-10-CM | POA: Diagnosis not present

## 2023-08-25 DIAGNOSIS — L814 Other melanin hyperpigmentation: Secondary | ICD-10-CM | POA: Diagnosis not present

## 2023-08-25 DIAGNOSIS — D225 Melanocytic nevi of trunk: Secondary | ICD-10-CM | POA: Diagnosis not present

## 2023-08-25 DIAGNOSIS — L578 Other skin changes due to chronic exposure to nonionizing radiation: Secondary | ICD-10-CM | POA: Diagnosis not present

## 2023-08-25 DIAGNOSIS — L570 Actinic keratosis: Secondary | ICD-10-CM | POA: Diagnosis not present

## 2023-08-25 DIAGNOSIS — L821 Other seborrheic keratosis: Secondary | ICD-10-CM | POA: Diagnosis not present

## 2023-08-25 DIAGNOSIS — Z08 Encounter for follow-up examination after completed treatment for malignant neoplasm: Secondary | ICD-10-CM | POA: Diagnosis not present

## 2023-09-13 DIAGNOSIS — D124 Benign neoplasm of descending colon: Secondary | ICD-10-CM | POA: Diagnosis not present

## 2023-09-13 DIAGNOSIS — D128 Benign neoplasm of rectum: Secondary | ICD-10-CM | POA: Diagnosis not present

## 2023-09-13 DIAGNOSIS — D123 Benign neoplasm of transverse colon: Secondary | ICD-10-CM | POA: Diagnosis not present

## 2023-09-13 DIAGNOSIS — K5730 Diverticulosis of large intestine without perforation or abscess without bleeding: Secondary | ICD-10-CM | POA: Diagnosis not present

## 2023-09-13 DIAGNOSIS — Z1211 Encounter for screening for malignant neoplasm of colon: Secondary | ICD-10-CM | POA: Diagnosis not present

## 2023-09-13 DIAGNOSIS — K633 Ulcer of intestine: Secondary | ICD-10-CM | POA: Diagnosis not present

## 2023-09-13 DIAGNOSIS — K649 Unspecified hemorrhoids: Secondary | ICD-10-CM | POA: Diagnosis not present

## 2023-09-15 DIAGNOSIS — D124 Benign neoplasm of descending colon: Secondary | ICD-10-CM | POA: Diagnosis not present

## 2023-09-15 DIAGNOSIS — D123 Benign neoplasm of transverse colon: Secondary | ICD-10-CM | POA: Diagnosis not present

## 2023-09-15 DIAGNOSIS — D128 Benign neoplasm of rectum: Secondary | ICD-10-CM | POA: Diagnosis not present

## 2023-09-19 DIAGNOSIS — E1142 Type 2 diabetes mellitus with diabetic polyneuropathy: Secondary | ICD-10-CM | POA: Diagnosis not present

## 2023-09-19 DIAGNOSIS — E782 Mixed hyperlipidemia: Secondary | ICD-10-CM | POA: Diagnosis not present

## 2023-09-19 DIAGNOSIS — E1169 Type 2 diabetes mellitus with other specified complication: Secondary | ICD-10-CM | POA: Diagnosis not present

## 2023-10-06 ENCOUNTER — Other Ambulatory Visit: Payer: Self-pay | Admitting: Cardiology

## 2023-10-06 DIAGNOSIS — E785 Hyperlipidemia, unspecified: Secondary | ICD-10-CM

## 2023-10-20 DIAGNOSIS — E1142 Type 2 diabetes mellitus with diabetic polyneuropathy: Secondary | ICD-10-CM | POA: Diagnosis not present

## 2023-10-20 DIAGNOSIS — E1169 Type 2 diabetes mellitus with other specified complication: Secondary | ICD-10-CM | POA: Diagnosis not present

## 2023-10-20 DIAGNOSIS — E782 Mixed hyperlipidemia: Secondary | ICD-10-CM | POA: Diagnosis not present

## 2023-11-03 DIAGNOSIS — Z08 Encounter for follow-up examination after completed treatment for malignant neoplasm: Secondary | ICD-10-CM | POA: Diagnosis not present

## 2023-11-03 DIAGNOSIS — Z85828 Personal history of other malignant neoplasm of skin: Secondary | ICD-10-CM | POA: Diagnosis not present

## 2023-11-03 DIAGNOSIS — D485 Neoplasm of uncertain behavior of skin: Secondary | ICD-10-CM | POA: Diagnosis not present

## 2023-11-03 DIAGNOSIS — L578 Other skin changes due to chronic exposure to nonionizing radiation: Secondary | ICD-10-CM | POA: Diagnosis not present

## 2023-11-03 DIAGNOSIS — C4442 Squamous cell carcinoma of skin of scalp and neck: Secondary | ICD-10-CM | POA: Diagnosis not present

## 2023-11-19 ENCOUNTER — Other Ambulatory Visit: Payer: Self-pay | Admitting: Cardiology

## 2023-11-19 DIAGNOSIS — E785 Hyperlipidemia, unspecified: Secondary | ICD-10-CM

## 2023-11-20 DIAGNOSIS — E1169 Type 2 diabetes mellitus with other specified complication: Secondary | ICD-10-CM | POA: Diagnosis not present

## 2023-11-20 DIAGNOSIS — E1142 Type 2 diabetes mellitus with diabetic polyneuropathy: Secondary | ICD-10-CM | POA: Diagnosis not present

## 2023-11-20 DIAGNOSIS — E782 Mixed hyperlipidemia: Secondary | ICD-10-CM | POA: Diagnosis not present

## 2023-12-06 DIAGNOSIS — E119 Type 2 diabetes mellitus without complications: Secondary | ICD-10-CM | POA: Diagnosis not present

## 2023-12-06 DIAGNOSIS — H40023 Open angle with borderline findings, high risk, bilateral: Secondary | ICD-10-CM | POA: Diagnosis not present

## 2023-12-06 DIAGNOSIS — H0102A Squamous blepharitis right eye, upper and lower eyelids: Secondary | ICD-10-CM | POA: Diagnosis not present

## 2023-12-06 DIAGNOSIS — H2513 Age-related nuclear cataract, bilateral: Secondary | ICD-10-CM | POA: Diagnosis not present

## 2023-12-06 DIAGNOSIS — H40053 Ocular hypertension, bilateral: Secondary | ICD-10-CM | POA: Diagnosis not present

## 2023-12-06 DIAGNOSIS — H0102B Squamous blepharitis left eye, upper and lower eyelids: Secondary | ICD-10-CM | POA: Diagnosis not present

## 2023-12-19 ENCOUNTER — Other Ambulatory Visit: Payer: Self-pay | Admitting: Cardiology

## 2023-12-19 DIAGNOSIS — E785 Hyperlipidemia, unspecified: Secondary | ICD-10-CM

## 2023-12-20 DIAGNOSIS — E1169 Type 2 diabetes mellitus with other specified complication: Secondary | ICD-10-CM | POA: Diagnosis not present

## 2023-12-20 DIAGNOSIS — E1142 Type 2 diabetes mellitus with diabetic polyneuropathy: Secondary | ICD-10-CM | POA: Diagnosis not present

## 2023-12-20 DIAGNOSIS — E782 Mixed hyperlipidemia: Secondary | ICD-10-CM | POA: Diagnosis not present

## 2024-01-02 ENCOUNTER — Other Ambulatory Visit: Payer: Self-pay | Admitting: Cardiology

## 2024-01-02 DIAGNOSIS — E785 Hyperlipidemia, unspecified: Secondary | ICD-10-CM

## 2024-01-03 DIAGNOSIS — E1169 Type 2 diabetes mellitus with other specified complication: Secondary | ICD-10-CM | POA: Diagnosis not present

## 2024-01-03 DIAGNOSIS — H6123 Impacted cerumen, bilateral: Secondary | ICD-10-CM | POA: Diagnosis not present

## 2024-01-03 DIAGNOSIS — E538 Deficiency of other specified B group vitamins: Secondary | ICD-10-CM | POA: Diagnosis not present

## 2024-01-03 DIAGNOSIS — E1142 Type 2 diabetes mellitus with diabetic polyneuropathy: Secondary | ICD-10-CM | POA: Diagnosis not present

## 2024-01-03 DIAGNOSIS — E782 Mixed hyperlipidemia: Secondary | ICD-10-CM | POA: Diagnosis not present

## 2024-01-03 DIAGNOSIS — I2510 Atherosclerotic heart disease of native coronary artery without angina pectoris: Secondary | ICD-10-CM | POA: Diagnosis not present

## 2024-01-03 DIAGNOSIS — Z23 Encounter for immunization: Secondary | ICD-10-CM | POA: Diagnosis not present

## 2024-01-03 DIAGNOSIS — Z0000 Encounter for general adult medical examination without abnormal findings: Secondary | ICD-10-CM | POA: Diagnosis not present

## 2024-01-03 DIAGNOSIS — I10 Essential (primary) hypertension: Secondary | ICD-10-CM | POA: Diagnosis not present

## 2024-01-03 DIAGNOSIS — Z79899 Other long term (current) drug therapy: Secondary | ICD-10-CM | POA: Diagnosis not present

## 2024-01-12 ENCOUNTER — Ambulatory Visit: Admitting: Podiatry

## 2024-01-12 ENCOUNTER — Ambulatory Visit

## 2024-01-12 VITALS — Ht 70.0 in | Wt 197.0 lb

## 2024-01-12 DIAGNOSIS — M79675 Pain in left toe(s): Secondary | ICD-10-CM | POA: Diagnosis not present

## 2024-01-12 DIAGNOSIS — B353 Tinea pedis: Secondary | ICD-10-CM

## 2024-01-12 DIAGNOSIS — L84 Corns and callosities: Secondary | ICD-10-CM

## 2024-01-12 DIAGNOSIS — E1142 Type 2 diabetes mellitus with diabetic polyneuropathy: Secondary | ICD-10-CM

## 2024-01-12 DIAGNOSIS — B351 Tinea unguium: Secondary | ICD-10-CM

## 2024-01-12 DIAGNOSIS — M79674 Pain in right toe(s): Secondary | ICD-10-CM

## 2024-01-12 DIAGNOSIS — Z0189 Encounter for other specified special examinations: Secondary | ICD-10-CM

## 2024-01-12 MED ORDER — KETOCONAZOLE 2 % EX CREA: 1.0000 | TOPICAL_CREAM | Freq: Every day | CUTANEOUS | 2 refills | Status: AC

## 2024-01-12 NOTE — Progress Notes (Addendum)
  Subjective:  Patient ID: Mathew Clements, male    DOB: Jul 22, 1945,  MRN: [id number]  Chief Complaint  Patient presents with   Diabetes    RM 8 New patient-diabetic neuropathy with risk for ulceration;new diabetic shoes.    78 y.o. male presents with the above complaint. History confirmed with patient.  He is referred for a new diabetic foot exam and is interested in diabetic shoes, this would been quite helpful in reducing his risk of ulceration as well as ambulatory functional status.  Thinks his last A1c was about 6.8%.  His nails become thickened elongated and cause discomfort his wife usually trims them but she recently had back surgery.  Objective:  Physical Exam: warm, good capillary refill, no  ulcerative lesions and normal DP and PT pulses.  There is minimal pedal hair growth and shiny atrophic skin and mild edema.  He has an abnormal monofilament exam with loss of protective sensation distally.  Onychomycosis x10 in all toenails with yellow dystrophic nails with subungual debris.  Preulcerative calluses medial hallux bilaterally.  Dry flaking skin with tinea pedis. Assessment:   1. Tinea pedis of both feet   2. Callus of foot   3. Pain due to onychomycosis of toenails of both feet   4. Encounter for diabetic foot exam (HCC)   5. Diabetic polyneuropathy associated with type 2 diabetes mellitus (HCC)      Plan:  Patient was evaluated and treated and all questions answered.  Patient educated on diabetes. Discussed proper diabetic foot care and discussed risks and complications of disease. Educated patient in depth on reasons to return to the office immediately should he/she discover anything concerning or new on the feet. All questions answered. Discussed proper shoes as well.  Diabetic shoes have greatly reduce his risk of nerve neuropathic ulceration and his functional status is improved with this.  I recommend we continue this and new referral was placed for this and sent to  [facility] the patient was given his prescription and medical necessity form to take to his primary care doctor for his appointment..  All symptomatic hyperkeratoses were safely debrided with a sterile #15 blade to patient's level of comfort without incident. We discussed preventative and palliative care of these lesions including supportive and accommodative shoegear, padding, prefabricated and custom molded accommodative orthoses, use of a pumice stone and lotions/creams daily.  Discussed the etiology and treatment options for the condition in detail with the patient. Educated patient on the topical and oral treatment options for mycotic nails. Recommended debridement of the nails today. Sharp and mechanical debridement performed of all painful and mycotic nails today. Nails debrided in length and thickness using a nail nipper to level of comfort. Discussed treatment options including appropriate shoe gear. Follow up as needed for painful nails.  Discussed the etiology and treatment options for tinea pedis.  Discussed topical and oral treatment.  Recommended topical treatment with 2% ketoconazole cream.  This was sent to the patient's pharmacy.  Also discussed appropriate foot hygiene, use of antifungal spray such as Tinactin in shoes, as well as cleaning her foot surfaces such as showers and bathroom floors with bleach.  Return if symptoms worsen or fail to improve.

## 2024-01-19 DIAGNOSIS — H40053 Ocular hypertension, bilateral: Secondary | ICD-10-CM | POA: Diagnosis not present

## 2024-01-19 DIAGNOSIS — H0102A Squamous blepharitis right eye, upper and lower eyelids: Secondary | ICD-10-CM | POA: Diagnosis not present

## 2024-01-19 DIAGNOSIS — H40023 Open angle with borderline findings, high risk, bilateral: Secondary | ICD-10-CM | POA: Diagnosis not present

## 2024-01-19 DIAGNOSIS — H2513 Age-related nuclear cataract, bilateral: Secondary | ICD-10-CM | POA: Diagnosis not present

## 2024-01-19 DIAGNOSIS — H0102B Squamous blepharitis left eye, upper and lower eyelids: Secondary | ICD-10-CM | POA: Diagnosis not present

## 2024-02-11 ENCOUNTER — Other Ambulatory Visit: Payer: Self-pay | Admitting: Cardiology

## 2024-02-11 DIAGNOSIS — E785 Hyperlipidemia, unspecified: Secondary | ICD-10-CM

## 2024-03-12 ENCOUNTER — Other Ambulatory Visit: Payer: Self-pay | Admitting: Cardiology

## 2024-03-12 DIAGNOSIS — E785 Hyperlipidemia, unspecified: Secondary | ICD-10-CM

## 2024-03-26 ENCOUNTER — Other Ambulatory Visit: Payer: Self-pay | Admitting: Cardiology

## 2024-03-26 DIAGNOSIS — E785 Hyperlipidemia, unspecified: Secondary | ICD-10-CM
